# Patient Record
Sex: Female | Born: 1960 | Race: Black or African American | Hispanic: No | Smoking: Current every day smoker
Health system: Southern US, Community
[De-identification: ages and names within clinical notes are randomized; demographics above are authoritative.]

## PROBLEM LIST (undated history)

## (undated) DIAGNOSIS — I10 Essential (primary) hypertension: Secondary | ICD-10-CM

## (undated) DIAGNOSIS — E119 Type 2 diabetes mellitus without complications: Secondary | ICD-10-CM

## (undated) DIAGNOSIS — E7800 Pure hypercholesterolemia, unspecified: Secondary | ICD-10-CM

## (undated) DIAGNOSIS — E785 Hyperlipidemia, unspecified: Secondary | ICD-10-CM

## (undated) DIAGNOSIS — T7840XA Allergy, unspecified, initial encounter: Secondary | ICD-10-CM

## (undated) HISTORY — DX: Type 2 diabetes mellitus without complications: E11.9

## (undated) HISTORY — DX: Hyperlipidemia, unspecified: E78.5

## (undated) HISTORY — PX: ABDOMINAL HYSTERECTOMY: SHX81

## (undated) HISTORY — DX: Pure hypercholesterolemia, unspecified: E78.00

## (undated) HISTORY — PX: APPENDECTOMY: SHX54

## (undated) HISTORY — DX: Allergy, unspecified, initial encounter: T78.40XA

---

## 1998-06-29 ENCOUNTER — Other Ambulatory Visit: Admission: RE | Admit: 1998-06-29 | Discharge: 1998-06-29 | Payer: Self-pay | Admitting: Obstetrics and Gynecology

## 1998-09-04 ENCOUNTER — Encounter: Payer: Self-pay | Admitting: Obstetrics and Gynecology

## 1998-09-07 ENCOUNTER — Encounter (INDEPENDENT_AMBULATORY_CARE_PROVIDER_SITE_OTHER): Payer: Self-pay | Admitting: Specialist

## 1998-09-07 ENCOUNTER — Inpatient Hospital Stay (HOSPITAL_COMMUNITY): Admission: RE | Admit: 1998-09-07 | Discharge: 1998-09-09 | Payer: Self-pay | Admitting: Obstetrics and Gynecology

## 2003-10-06 ENCOUNTER — Emergency Department (HOSPITAL_COMMUNITY): Admission: EM | Admit: 2003-10-06 | Discharge: 2003-10-06 | Payer: Self-pay | Admitting: Family Medicine

## 2003-10-19 ENCOUNTER — Ambulatory Visit (HOSPITAL_COMMUNITY): Admission: RE | Admit: 2003-10-19 | Discharge: 2003-10-19 | Payer: Self-pay | Admitting: Obstetrics & Gynecology

## 2007-11-10 ENCOUNTER — Ambulatory Visit: Payer: Self-pay | Admitting: Family Medicine

## 2007-11-10 DIAGNOSIS — R519 Headache, unspecified: Secondary | ICD-10-CM

## 2007-11-10 DIAGNOSIS — Z8742 Personal history of other diseases of the female genital tract: Secondary | ICD-10-CM | POA: Insufficient documentation

## 2007-11-10 DIAGNOSIS — R51 Headache: Secondary | ICD-10-CM

## 2007-11-10 DIAGNOSIS — E1159 Type 2 diabetes mellitus with other circulatory complications: Secondary | ICD-10-CM

## 2007-11-10 DIAGNOSIS — I10 Essential (primary) hypertension: Secondary | ICD-10-CM

## 2007-11-10 HISTORY — DX: Headache, unspecified: R51.9

## 2007-11-12 LAB — CONVERTED CEMR LAB
ALT: 13 units/L (ref 0–35)
AST: 14 units/L (ref 0–37)
Albumin: 3.7 g/dL (ref 3.5–5.2)
Alkaline Phosphatase: 63 units/L (ref 39–117)
BUN: 11 mg/dL (ref 6–23)
Basophils Absolute: 0.1 10*3/uL (ref 0.0–0.1)
Basophils Relative: 0.8 % (ref 0.0–3.0)
Bilirubin, Direct: 0.1 mg/dL (ref 0.0–0.3)
CO2: 30 meq/L (ref 19–32)
Calcium: 8.9 mg/dL (ref 8.4–10.5)
Chloride: 110 meq/L (ref 96–112)
Creatinine, Ser: 0.9 mg/dL (ref 0.4–1.2)
Eosinophils Absolute: 0.2 10*3/uL (ref 0.0–0.7)
Eosinophils Relative: 2.6 % (ref 0.0–5.0)
GFR calc Af Amer: 87 mL/min
GFR calc non Af Amer: 72 mL/min
Glucose, Bld: 77 mg/dL (ref 70–99)
HCT: 39 % (ref 36.0–46.0)
Hemoglobin: 13.2 g/dL (ref 12.0–15.0)
Lymphocytes Relative: 33 % (ref 12.0–46.0)
MCHC: 34 g/dL (ref 30.0–36.0)
MCV: 87 fL (ref 78.0–100.0)
Monocytes Absolute: 0.3 10*3/uL (ref 0.1–1.0)
Monocytes Relative: 3.8 % (ref 3.0–12.0)
Neutro Abs: 5.1 10*3/uL (ref 1.4–7.7)
Neutrophils Relative %: 59.8 % (ref 43.0–77.0)
Platelets: 178 10*3/uL (ref 150–400)
Potassium: 4.3 meq/L (ref 3.5–5.1)
RBC: 4.48 M/uL (ref 3.87–5.11)
RDW: 14 % (ref 11.5–14.6)
Sodium: 142 meq/L (ref 135–145)
TSH: 1.5 microintl units/mL (ref 0.35–5.50)
Total Bilirubin: 0.5 mg/dL (ref 0.3–1.2)
Total Protein: 6.7 g/dL (ref 6.0–8.3)
WBC: 8.5 10*3/uL (ref 4.5–10.5)

## 2013-06-10 ENCOUNTER — Emergency Department (HOSPITAL_COMMUNITY): Payer: No Typology Code available for payment source

## 2013-06-10 ENCOUNTER — Emergency Department (HOSPITAL_COMMUNITY)
Admission: EM | Admit: 2013-06-10 | Discharge: 2013-06-10 | Disposition: A | Discharge status: Home / Self Care | Payer: No Typology Code available for payment source | Attending: Emergency Medicine | Admitting: Emergency Medicine

## 2013-06-10 DIAGNOSIS — I10 Essential (primary) hypertension: Secondary | ICD-10-CM | POA: Insufficient documentation

## 2013-06-10 DIAGNOSIS — Z880 Allergy status to penicillin: Secondary | ICD-10-CM | POA: Insufficient documentation

## 2013-06-10 DIAGNOSIS — R55 Syncope and collapse: Secondary | ICD-10-CM | POA: Insufficient documentation

## 2013-06-10 DIAGNOSIS — J40 Bronchitis, not specified as acute or chronic: Secondary | ICD-10-CM | POA: Insufficient documentation

## 2013-06-10 LAB — BASIC METABOLIC PANEL
BUN: 11 mg/dL (ref 6–23)
CO2: 26 mEq/L (ref 19–32)
Calcium: 9 mg/dL (ref 8.4–10.5)
Chloride: 100 mEq/L (ref 96–112)
Creatinine, Ser: 0.88 mg/dL (ref 0.50–1.10)
GFR calc Af Amer: 86 mL/min — ABNORMAL LOW (ref >90)
GFR calc non Af Amer: 74 mL/min — ABNORMAL LOW (ref >90)
Glucose, Bld: 122 mg/dL — ABNORMAL HIGH (ref 70–99)
Potassium: 3.4 mEq/L — ABNORMAL LOW (ref 3.7–5.3)
Sodium: 140 mEq/L (ref 137–147)

## 2013-06-10 LAB — URINALYSIS, ROUTINE W REFLEX MICROSCOPIC
Bilirubin Urine: NEGATIVE
Glucose, UA: 100 mg/dL — AB
Ketones, ur: NEGATIVE mg/dL
Leukocytes, UA: NEGATIVE
Nitrite: NEGATIVE
Protein, ur: 30 mg/dL — AB
Specific Gravity, Urine: 1.023 (ref 1.005–1.030)
Urobilinogen, UA: 1 mg/dL (ref 0.0–1.0)
pH: 7 (ref 5.0–8.0)

## 2013-06-10 LAB — URINE MICROSCOPIC-ADD ON

## 2013-06-10 LAB — CBC WITH DIFFERENTIAL/PLATELET
Basophils Absolute: 0 10*3/uL (ref 0.0–0.1)
Basophils Relative: 0 % (ref 0–1)
Eosinophils Absolute: 0.2 10*3/uL (ref 0.0–0.7)
Eosinophils Relative: 3 % (ref 0–5)
HCT: 40.7 % (ref 36.0–46.0)
Hemoglobin: 13.7 g/dL (ref 12.0–15.0)
Lymphocytes Relative: 30 % (ref 12–46)
Lymphs Abs: 1.7 10*3/uL (ref 0.7–4.0)
MCH: 28.4 pg (ref 26.0–34.0)
MCHC: 33.7 g/dL (ref 30.0–36.0)
MCV: 84.3 fL (ref 78.0–100.0)
Monocytes Absolute: 0.7 10*3/uL (ref 0.1–1.0)
Monocytes Relative: 13 % — ABNORMAL HIGH (ref 3–12)
Neutro Abs: 3.1 10*3/uL (ref 1.7–7.7)
Neutrophils Relative %: 54 % (ref 43–77)
Platelets: 150 10*3/uL (ref 150–400)
RBC: 4.83 MIL/uL (ref 3.87–5.11)
RDW: 14.9 % (ref 11.5–15.5)
WBC: 5.7 10*3/uL (ref 4.0–10.5)

## 2013-06-10 LAB — TROPONIN I: Troponin I: <0.3 ng/mL (ref <0.30)

## 2013-06-10 MED ORDER — LORAZEPAM 1 MG PO TABS
1.0000 mg | ORAL_TABLET | Freq: Once | ORAL | Status: AC
  Administered 2013-06-10: 1 mg via ORAL
  Filled 2013-06-10: qty 1

## 2013-06-10 MED ORDER — IBUPROFEN 200 MG PO TABS
600.0000 mg | ORAL_TABLET | Freq: Once | ORAL | Status: AC
  Administered 2013-06-10: 600 mg via ORAL
  Filled 2013-06-10: qty 3

## 2013-06-10 MED ORDER — ALBUTEROL SULFATE (2.5 MG/3ML) 0.083% IN NEBU
5.0000 mg | INHALATION_SOLUTION | Freq: Once | RESPIRATORY_TRACT | Status: AC
  Administered 2013-06-10: 5 mg via RESPIRATORY_TRACT
  Filled 2013-06-10: qty 6

## 2013-06-10 MED ORDER — IPRATROPIUM BROMIDE 0.02 % IN SOLN
0.5000 mg | Freq: Once | RESPIRATORY_TRACT | Status: AC
  Administered 2013-06-10: 0.5 mg via RESPIRATORY_TRACT
  Filled 2013-06-10: qty 2.5

## 2013-06-10 MED ORDER — PREDNISONE 20 MG PO TABS
60.0000 mg | ORAL_TABLET | Freq: Once | ORAL | Status: AC
  Administered 2013-06-10: 60 mg via ORAL
  Filled 2013-06-10: qty 3

## 2013-06-10 MED ORDER — ALBUTEROL SULFATE HFA 108 (90 BASE) MCG/ACT IN AERS
2.0000 | INHALATION_SPRAY | Freq: Once | RESPIRATORY_TRACT | Status: AC
  Administered 2013-06-10: 2 via RESPIRATORY_TRACT
  Filled 2013-06-10: qty 6.7

## 2013-06-10 MED ORDER — PREDNISONE 20 MG PO TABS: 40.0000 mg | ORAL_TABLET | Freq: Every day | ORAL | Status: DC

## 2013-06-10 NOTE — ED Provider Notes (Signed)
CSN: [id number]     Arrival date & time 06/10/13  1404 History   First MD Initiated Contact with Patient 06/10/13 1421     Chief Complaint  Patient presents with  . Loss of Consciousness     (Consider location/radiation/quality/duration/timing/severity/associated sxs/prior Treatment) HPI  52yF with syncope. Cough and congestion for past 3 days. Progressively worsening. Wheezing. Just before arrival she was sitting on edge of bed speaking with employer to tell them she wasn't feeling well when she began having increasing SOB and felt her throat closing. Thinks she then passed out. Not sure for how long. Woke up with people around her. Currently feeling better but still tightness in throat and chest. Fatigue. No fever. No unusual leg pain or swelling. No v/d. No urinary complaints. No diagnosed hx of lung disease but doesn't have routine medical care. Previous diagnosis of HTN, not on meds currently.   No past medical history on file. No past surgical history on file. No family history on file. History  Substance Use Topics  . Smoking status: Not on file  . Smokeless tobacco: Not on file  . Alcohol Use: Not on file   OB History   No data available     Review of Systems  All systems reviewed and negative, other than as noted in HPI.   Allergies  Penicillins  Home Medications  No current outpatient prescriptions on file. BP 168/113  Pulse 81  Resp 20  SpO2 100% Physical Exam  Nursing note and vitals reviewed. Constitutional: She appears well-developed and well-nourished. No distress.  HENT:  Head: Normocephalic and atraumatic.  Eyes: Conjunctivae are normal. Right eye exhibits no discharge. Left eye exhibits no discharge.  Neck: Neck supple.  Cardiovascular: Normal rate, regular rhythm and normal heart sounds.  Exam reveals no gallop and no friction rub.   No murmur heard. Pulmonary/Chest: Effort normal. No respiratory distress. She has wheezes.  B/l wheezing, L>R. mildy  prolonged expiratory phase. Can speak in complete sentences.   Abdominal: Soft. She exhibits no distension. There is no tenderness.  Musculoskeletal: She exhibits no edema and no tenderness.  Lower extremities symmetric as compared to each other. No calf tenderness. Negative Homan's. No palpable cords.   Neurological: She is alert.  Skin: Skin is warm and dry.  Psychiatric: She has a normal mood and affect. Her behavior is normal. Thought content normal.    ED Course  Procedures (including critical care time) Labs Review Labs Reviewed - No data to display Imaging Review No results found.   EKG Interpretation   Date/Time:  Thursday June 10 2013 14:18:55 EDT Ventricular Rate:  84 PR Interval:  154 QRS Duration: 85 QT Interval:  399 QTC Calculation: 472 R Axis:   70 Text Interpretation:  Sinus rhythm Borderline T wave abnormalities  Confirmed by Wilson Singer  MD, Shakerra Red ([id number]) on 06/10/2013 2:22:25 PM      MDM   Final diagnoses:  Bronchitis  Syncope    52yF with what is clinically bronchitis. Suspect viral illness. Improved with neb. Continued PRN albuterol and steroids. Unclear etiology of syncope. Would expect bronchospasm to point of syncope to have been in extremis. She is wheezing, but WOB not dramatically increased and o2 sats fine. Possibly hyperventilation? EKG with normal intervals. CXR clear. Labs fairly unrevealing. I feel safe for discharge. Return precautions discussed.    Virgel Manifold, MD 06/11/13 [id number]

## 2013-06-10 NOTE — ED Notes (Signed)
Walked pt to the bathroom without difficulty

## 2013-06-10 NOTE — ED Notes (Signed)
Prior to ambulating patient denies chest pain upon ambulating stated 6/10 pressure chest pain.  Pulse oximetry 98% room air. EDP notified and at bedside speaking with patient.  Patient stated "wants to go home"

## 2013-06-10 NOTE — ED Notes (Signed)
Patient in yard felt throat closing syncope unknown amount of time per EMS patient alert answering and following commands appropriate. EKG NSR CBG 120.  Albuterol 5mg  given wheezing left lung.

## 2013-06-10 NOTE — Discharge Instructions (Signed)
°Emergency Department Resource Guide °1) Find a Doctor and Pay Out of Pocket °Although you won't have to find out who is covered by your insurance plan, it is a good idea to ask around and get recommendations. You will then need to call the office and see if the doctor you have chosen will accept you as a new patient and what types of options they offer for patients who are self-pay. Some doctors offer discounts or will set up payment plans for their patients who do not have insurance, but you will need to ask so you aren't surprised when you get to your appointment. ° °2) Contact Your Local Health Department °Not all health departments have doctors that can see patients for sick visits, but many do, so it is worth a call to see if yours does. If you don't know where your local health department is, you can check in your phone book. The CDC also has a tool to help you locate your state's health department, and many state websites also have listings of all of their local health departments. ° °3) Find a Walk-in Clinic °If your illness is not likely to be very severe or complicated, you may want to try a walk in clinic. These are popping up all over the country in pharmacies, drugstores, and shopping centers. They're usually staffed by nurse practitioners or physician assistants that have been trained to treat common illnesses and complaints. They're usually fairly quick and inexpensive. However, if you have serious medical issues or chronic medical problems, these are probably not your best option. ° °No Primary Care Doctor: °- Call [facility] at  832-8000 - they can help you locate a primary care doctor that  accepts your insurance, provides certain services, etc. °- Physician Referral Service- 1-800-533-3463 ° °Chronic Pain Problems: °Organization         Address  Phone   Notes  °[facility]  (336) 297-2271 Patients need to be referred by their primary care doctor.  ° °Medication  Assistance: °Organization         Address  Phone   Notes  °[place] [facility] [address] °[place], [place] [address] (336) 641-8030 --Must be a resident of [place] °-- Must have NO insurance coverage whatsoever (no Medicaid/ Medicare, etc.) °-- The pt. MUST have a primary care doctor that directs their care regularly and follows them in the community °  °[facility]  (866) 331-1348   °[employer]  (888) 892-1162   ° °Agencies that provide inexpensive medical care: °Organization         Address  Phone   Notes  °[facility]  (336) 832-8035   °[facility]    (336) 832-7272   °[facility] [address] °[place], [place] [address] (336) 832-4777   °[facility] [address], °[place] (336) 271-4999   °[facility]    (336) 373-0678   °[facility]    (336) 272-1050   °[facility] ° [address], [place] Phone:  (336) 832-4444, Fax:  (336) 832-4440 Hours of Operation:  9 am - 6 pm, M-F.  Also accepts Medicaid/Medicare and self-pay.  °[facility] ° [address], [place] Phone: (336) 832-3150, Fax: (336) 832-3151. Hours of Operation:  8:30 am - 5:30 pm, M-F.  Also accepts Medicaid and self-pay.  °[facility] [place] [address], [place] Phone: (336) 878-6027   °[facility] [address], [place], [place] (336)723-1848, Ext. 123 Mondays & Thursdays: 7-9 AM.  First 15 patients are seen on a first come, first serve basis. °  ° °Medicaid-accepting [place] Providers: ° °Organization         Address  Phone   Notes  °[facility] [address], [place] (336) 641-2100 Also accepts self-pay patients.  °[facility] [address], [place] ° (336) 856-9996   °[facility] [address], [place]  (336) 288-8857   °[facility] [address] [place] [address], [place] (336) 299-7000   °Veita Bland [address], [place]  ° (336) 373-1557 Only accepts [place] Access Medicaid patients after they have their name applied to their card.  ° °Self-Pay (no insurance) in [place]: ° °Organization         Address  Phone   Notes  °Sickle Cell Patients, [facility] [address], [place] (336) 832-1970   °[facility] [address], [place] (336) 832-4400   °[facility] ° [address], [place] (336) 992-4800   °[facility]/Dr. Osei-Bonsu ° [address] [place] [address], [place] or [address], [place] (336) 841-8500 Phone number for both [place] and [place] locations is the same.  °[facility] [address], [place] (336) 299-0000   °[facility] [address] [place] [address], [place] or [address] (336) 852-7530 °(336) 878-2260   °[facility] [address], [place] (336) 350-1642, phone; (336) 294-5005, fax Sees patients 1st and 3rd Saturday of every month.  Must not qualify for public or private insurance (i.e. Medicaid, Medicare, [place] Health Choice, Veterans' Benefits) • Household income should be no more than 200% of the poverty level •The clinic cannot treat you if you are pregnant or think you are pregnant • Sexually transmitted diseases are not treated at the clinic.  ° ° °Dental Care: °Organization         Address  Phone  Notes  °[place] [facility] [address], [place] (336) 641-6152 Accepts children up to age 21 who are enrolled in Medicaid or [place] Health Choice; pregnant women with a Medicaid card; and children who have applied for Medicaid or [place] Health Choice, but were declined, whose parents can pay a reduced fee at time of service.  °[place]  [facility] [place]  [address], [place] (336) 641-7733 Accepts children up to age 21 who are enrolled in Medicaid or [place] Health Choice; pregnant women with a Medicaid card; and children who have applied for Medicaid or [place] Health Choice, but were declined, whose parents can pay a reduced fee at time of service.  °[facility] ° [address], [place] (336) 641-4533 Patients are seen by appointment only. Walk-ins are not accepted. [facility] will see patients 18 years of age and older. °Monday - Tuesday (8am-5pm) °Most Wednesdays (8:30-5pm) °$30 per visit, cash only  °[facility] ° [address], [place] (336) 641-4533 Patients are seen by appointment only. Walk-ins are not accepted. [facility] will see patients 18 years of age and older. °One   Wednesday Evening (Monthly: Volunteer Based).  $30 per visit, cash only  °[facility]  (919) 537-3737 for adults; Children under age 4, call Graduate Pediatric Dentistry at (919) [phone]. Children aged 4-14, please call (919) [phone] to request a pediatric application. ° Dental services are provided in all areas of dental care including fillings, crowns and bridges, complete and partial dentures, implants, gum treatment, root canals, and extractions. Preventive care is also provided. Treatment is provided to both adults and children. °Patients are selected via a lottery and there is often a waiting list. °  °[facility] [address], °[place] ° (336) 763-8833 [email or web address] °  °[facility] [address], [place], [place] (336)723-1848, Ext. 123 Second and Fourth Thursday of each month, opens at 6:30 AM; Clinic ends at 9 AM.  Patients are seen on a first-come first-served basis, and a limited number are seen during each clinic.  ° °[facility] ° [address], [place], [place] (336) 723-7904    Eligibility Requirements °You must have lived in [place], [place], or [place] counties for at least the last three months. °  You cannot be eligible for state or federal sponsored healthcare insurance, including Veterans Administration, Medicaid, or Medicare. °  You generally cannot be eligible for healthcare insurance through your employer.  °  How to apply: °Eligibility screenings are held every Tuesday and Wednesday afternoon from 1:00 pm until 4:00 pm. You do not need an appointment for the interview!  °[facility] [address], [place], [place] 336-631-2330   °[facility]  336-342-8273   °[facility]  336-703-3100   °[facility]  336-570-6415   ° °Behavioral Health Resources in the Community: °Intensive Outpatient Programs °Organization         Address  Phone  Notes  °[facility] [address], [place], [place] 336-878-6098   °[facility] [address], [place], [place] 336-832-9800   °[facility] [address], [place], [place] ° 336-882-2125   °[facility] [address],  °[place], [place] 1-800-853-5163 or 336-641-4981   °Substance Abuse Resources °Organization         Address  Phone  Notes  °[facility]  336-882-2125   °[facility]  336-784-9470   °[facility]  336-285-9073   °[facility]  336-845-3988   °Residential & Outpatient Substance Abuse Program  1-800-659-3381   °Psychological Services °Organization         Address  Phone  Notes  °[facility]  336- 832-9600   °[facility]  336- 378-7881   °[facility] [address], [place] 1-800-853-5163 or 336-641-4981   ° °Mobile Crisis Teams °Organization         Address  Phone  Notes  °[facility], Mobile Crisis Care Unit  1-877-626-1772   °Assertive °[facility] ° [address]  [place], [place] 336-834-9664   °Sharon DeEsch [address] °[place]  336-554-5454   ° °Self-Help/Support Groups °Organization         Address  Phone             Notes  °[facility] - variety of support groups  336- 373-1402 Call for more information  °Narcotics Anonymous (NA), [facility] [address], °[place]   2 meetings at this location  ° °  Residential Treatment Programs °Organization         Address  Phone  Notes  °[facility] [address],    °[place]  1-866-801-8205   °[facility] ° [address], [place], [place] 704-293-8524   °[facility] [address], [place] 336-845-3988 Admissions: 8am-3pm M-F  °[facility] [address]    °[place], [place] 336-841-1104   °[facility] [address], [place], [place] 336-379-7146   °[facility] [address]  °[place], [place] 336-285-9073   °[facility] - Intensive Outpatient [address], [place], [place] 336-852-3033   °[facility] [address]  °[place], [place] 1-877-615-2722 or 336-784-9470   °[facility] [address] [place], [place] 336-227-7417 Accepts Medicaid  °[facility] [address]  °[place] 1-800-659-3381 Substance Abuse/Addiction Treatment  ° °[place] Behavioral Health Resources °Organization         Address  Phone  Notes  °[facility]  (888) 581-9988   °Julie Brannon, PhD [address] [place], [place]   (336) 349-5553 or (336) 951-0000   °[facility]   [address] °[place], [place] (336) 349-4454   °[facility] [address], [place], [place] (336) 342-8316 Insurance/Medicaid/sponsorship through [facility]  °[facility] [address]                                    [place], [place] (336) 342-8316 Therapy/tele-psych/case    °[facility] [address]  ° [place], [place] (336) 349-2233    °Dr. Arfeen  (336) 349-4544   °[facility] [place]  [employer] [place] [facility] 1) [address], [place] °2) [address], [place] °3)  [address], [place] (336) 349-3220 °(336) 342-7768 ° °(336) 342-8140   °[place] [facility] (336) 342-1394 or (336) 342-3537 (After Hours)    ° ° °

## 2013-06-10 NOTE — ED Notes (Signed)
Another Nurse assisted patient from bathroom to room via wheelchair after patient ambulated to bathroom. States short of breath and general weakness. EDP notified. Ordered patient to ambulate with pulse oximetry.

## 2013-06-11 MED ORDER — PREDNISONE 20 MG PO TABS: 40.0000 mg | ORAL_TABLET | Freq: Every day | ORAL | Status: DC

## 2013-06-11 NOTE — Progress Notes (Signed)
ED CM received incoming call from patient and daughter regarding discharge medication. Pt states that she was discharged and forgot prescriptions. Pt was discharged on prednisone. Spoke with Dr. Karle Starch script printed for predniSONE (DELTASONE) 20 MG tablet Take 2 tablets (40 mg total) by mouth daily. Notified patient of prescription ready for pick up, also offered to fax to pharmacy patient's choice. Pt  States, she has located the prescription for prednisone.  Pt also states, she needs a letter for work. Pt instructed to pick up work letter at [employer]. Discussed the importance of following up with a PCP, information was given for the [facility].  Pt verbalized understanding and is agreeable to plan. No further ED CM needs identified.

## 2016-10-06 ENCOUNTER — Encounter (HOSPITAL_COMMUNITY): Payer: Self-pay | Admitting: Emergency Medicine

## 2016-10-06 ENCOUNTER — Other Ambulatory Visit: Payer: Self-pay

## 2016-10-06 ENCOUNTER — Emergency Department (HOSPITAL_COMMUNITY)
Admission: EM | Admit: 2016-10-06 | Discharge: 2016-10-07 | Disposition: A | Discharge status: Home / Self Care | Payer: No Typology Code available for payment source | Attending: Emergency Medicine | Admitting: Emergency Medicine

## 2016-10-06 ENCOUNTER — Emergency Department (HOSPITAL_COMMUNITY): Payer: No Typology Code available for payment source

## 2016-10-06 DIAGNOSIS — Z79899 Other long term (current) drug therapy: Secondary | ICD-10-CM | POA: Insufficient documentation

## 2016-10-06 DIAGNOSIS — J181 Lobar pneumonia, unspecified organism: Secondary | ICD-10-CM | POA: Insufficient documentation

## 2016-10-06 DIAGNOSIS — J189 Pneumonia, unspecified organism: Secondary | ICD-10-CM

## 2016-10-06 DIAGNOSIS — R0602 Shortness of breath: Secondary | ICD-10-CM | POA: Insufficient documentation

## 2016-10-06 DIAGNOSIS — R739 Hyperglycemia, unspecified: Secondary | ICD-10-CM | POA: Insufficient documentation

## 2016-10-06 DIAGNOSIS — F17210 Nicotine dependence, cigarettes, uncomplicated: Secondary | ICD-10-CM | POA: Insufficient documentation

## 2016-10-06 DIAGNOSIS — I10 Essential (primary) hypertension: Secondary | ICD-10-CM | POA: Insufficient documentation

## 2016-10-06 HISTORY — DX: Essential (primary) hypertension: I10

## 2016-10-06 LAB — COMPREHENSIVE METABOLIC PANEL
ALT: 13 U/L — AB (ref 14–54)
ANION GAP: 8 (ref 5–15)
AST: 14 U/L — ABNORMAL LOW (ref 15–41)
Albumin: 3.1 g/dL — ABNORMAL LOW (ref 3.5–5.0)
Alkaline Phosphatase: 77 U/L (ref 38–126)
BUN: 10 mg/dL (ref 6–20)
CALCIUM: 8.9 mg/dL (ref 8.9–10.3)
CHLORIDE: 99 mmol/L — AB (ref 101–111)
CO2: 23 mmol/L (ref 22–32)
Creatinine, Ser: 1.01 mg/dL — ABNORMAL HIGH (ref 0.44–1.00)
GFR calc non Af Amer: >60 mL/min (ref >60)
GLUCOSE: 346 mg/dL — AB (ref 65–99)
Potassium: 4.1 mmol/L (ref 3.5–5.1)
Sodium: 130 mmol/L — ABNORMAL LOW (ref 135–145)
Total Bilirubin: 0.4 mg/dL (ref 0.3–1.2)
Total Protein: 6.5 g/dL (ref 6.5–8.1)

## 2016-10-06 LAB — URINALYSIS, ROUTINE W REFLEX MICROSCOPIC
Bacteria, UA: NONE SEEN
Bilirubin Urine: NEGATIVE
Glucose, UA: >=500 mg/dL — AB
Ketones, ur: NEGATIVE mg/dL
Leukocytes, UA: NEGATIVE
Nitrite: NEGATIVE
PH: 6 (ref 5.0–8.0)
Protein, ur: 30 mg/dL — AB
Specific Gravity, Urine: 1.022 (ref 1.005–1.030)

## 2016-10-06 LAB — CBC WITH DIFFERENTIAL/PLATELET
Basophils Absolute: 0 10*3/uL (ref 0.0–0.1)
Basophils Relative: 0 %
EOS ABS: 0.2 10*3/uL (ref 0.0–0.7)
Eosinophils Relative: 2 %
HEMATOCRIT: 36.6 % (ref 36.0–46.0)
HEMOGLOBIN: 12.3 g/dL (ref 12.0–15.0)
Lymphocytes Relative: 20 %
Lymphs Abs: 2 10*3/uL (ref 0.7–4.0)
MCH: 27.1 pg (ref 26.0–34.0)
MCHC: 33.6 g/dL (ref 30.0–36.0)
MCV: 80.6 fL (ref 78.0–100.0)
MONOS PCT: 5 %
Monocytes Absolute: 0.5 10*3/uL (ref 0.1–1.0)
NEUTROS ABS: 7.3 10*3/uL (ref 1.7–7.7)
NEUTROS PCT: 73 %
Platelets: 181 10*3/uL (ref 150–400)
RBC: 4.54 MIL/uL (ref 3.87–5.11)
RDW: 14.9 % (ref 11.5–15.5)
WBC: 10 10*3/uL (ref 4.0–10.5)

## 2016-10-06 LAB — PROTIME-INR
INR: 1.04
PROTHROMBIN TIME: 13.6 s (ref 11.4–15.2)

## 2016-10-06 LAB — I-STAT CG4 LACTIC ACID, ED: Lactic Acid, Venous: 1.71 mmol/L (ref 0.5–1.9)

## 2016-10-06 IMAGING — DX DG CHEST 2V
2 series · 2 of 2 positions shown · non-contrast
Comparison: Radiographs [DATE].

CLINICAL DATA: Sepsis.

EXAM:
CHEST  2 VIEW

[chest lat]
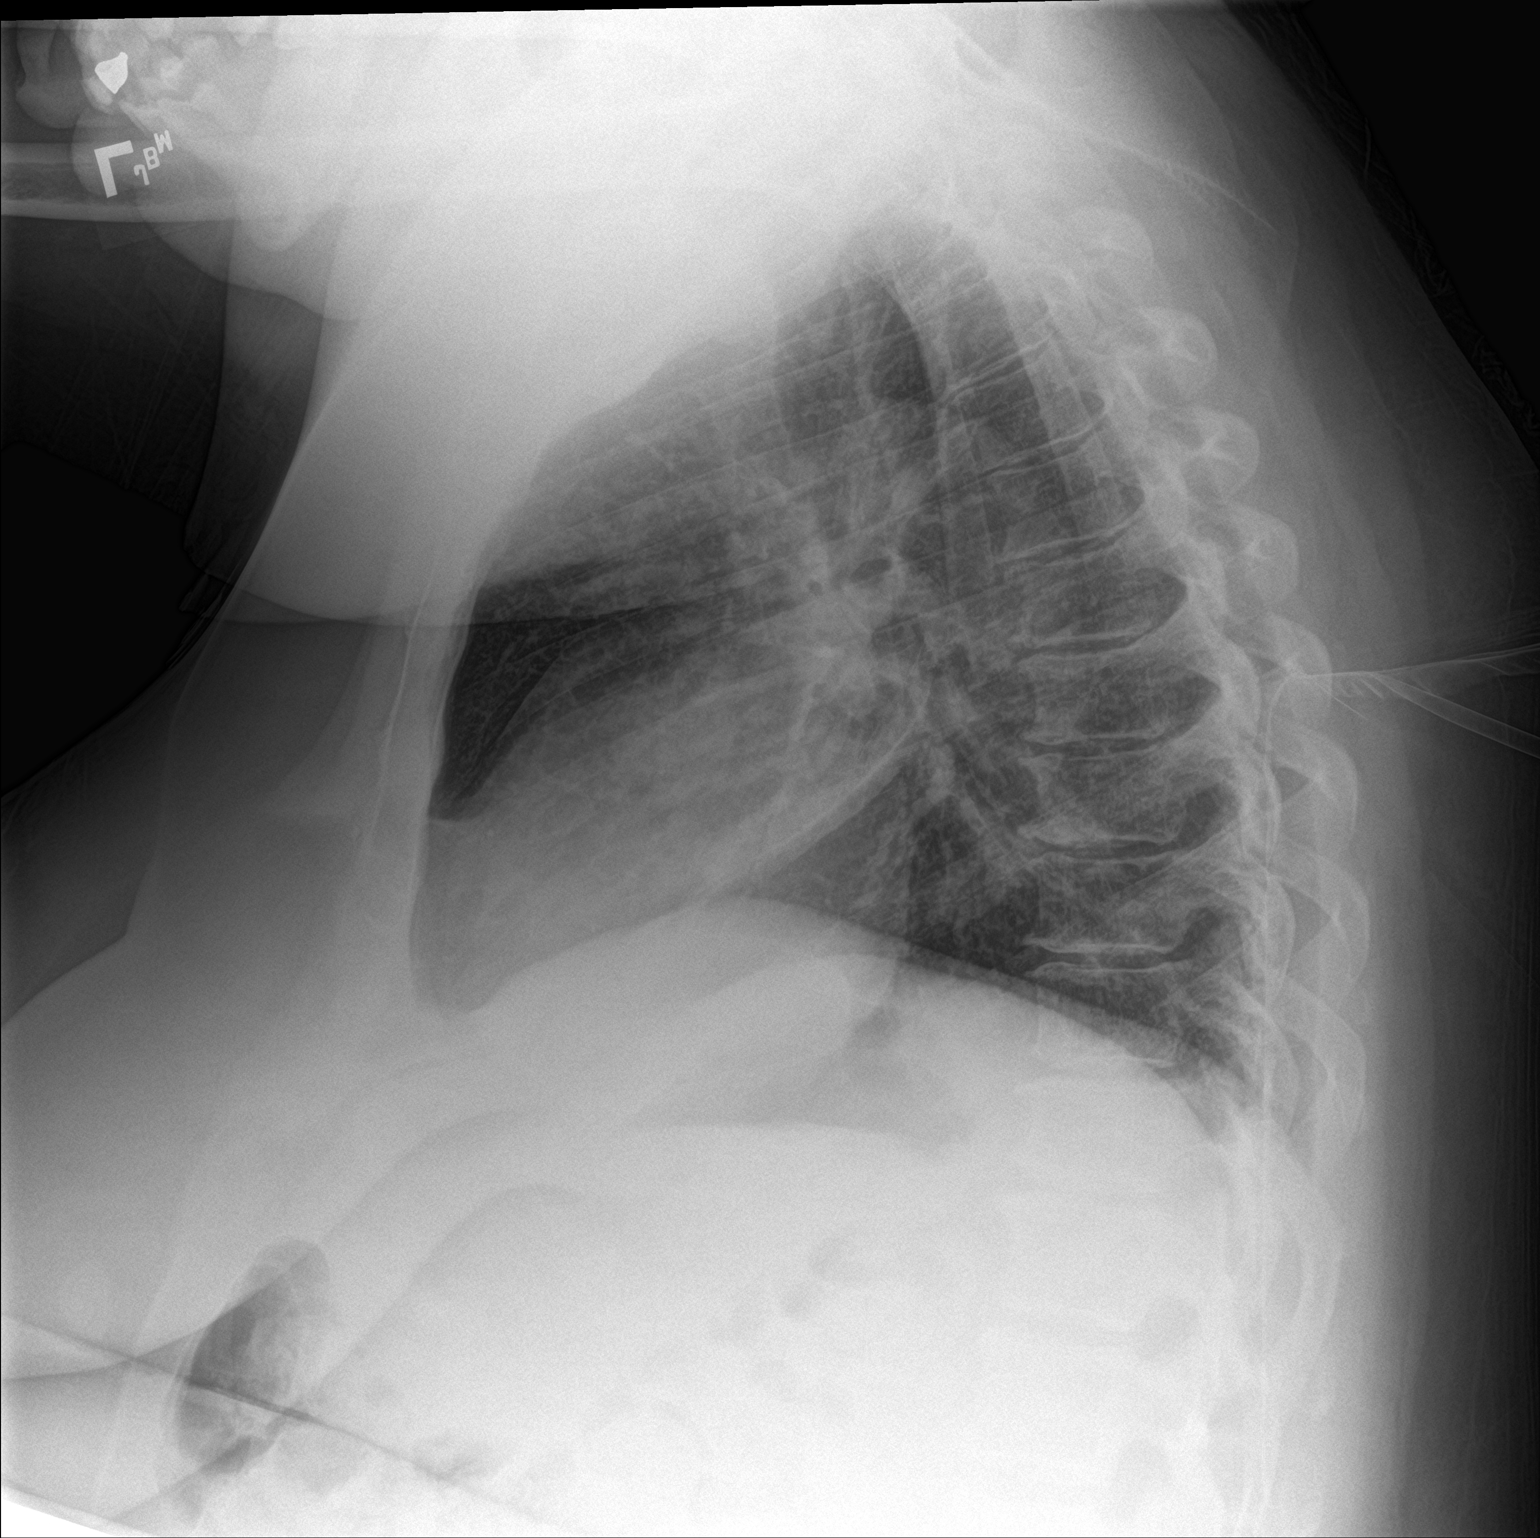

[chest ap]
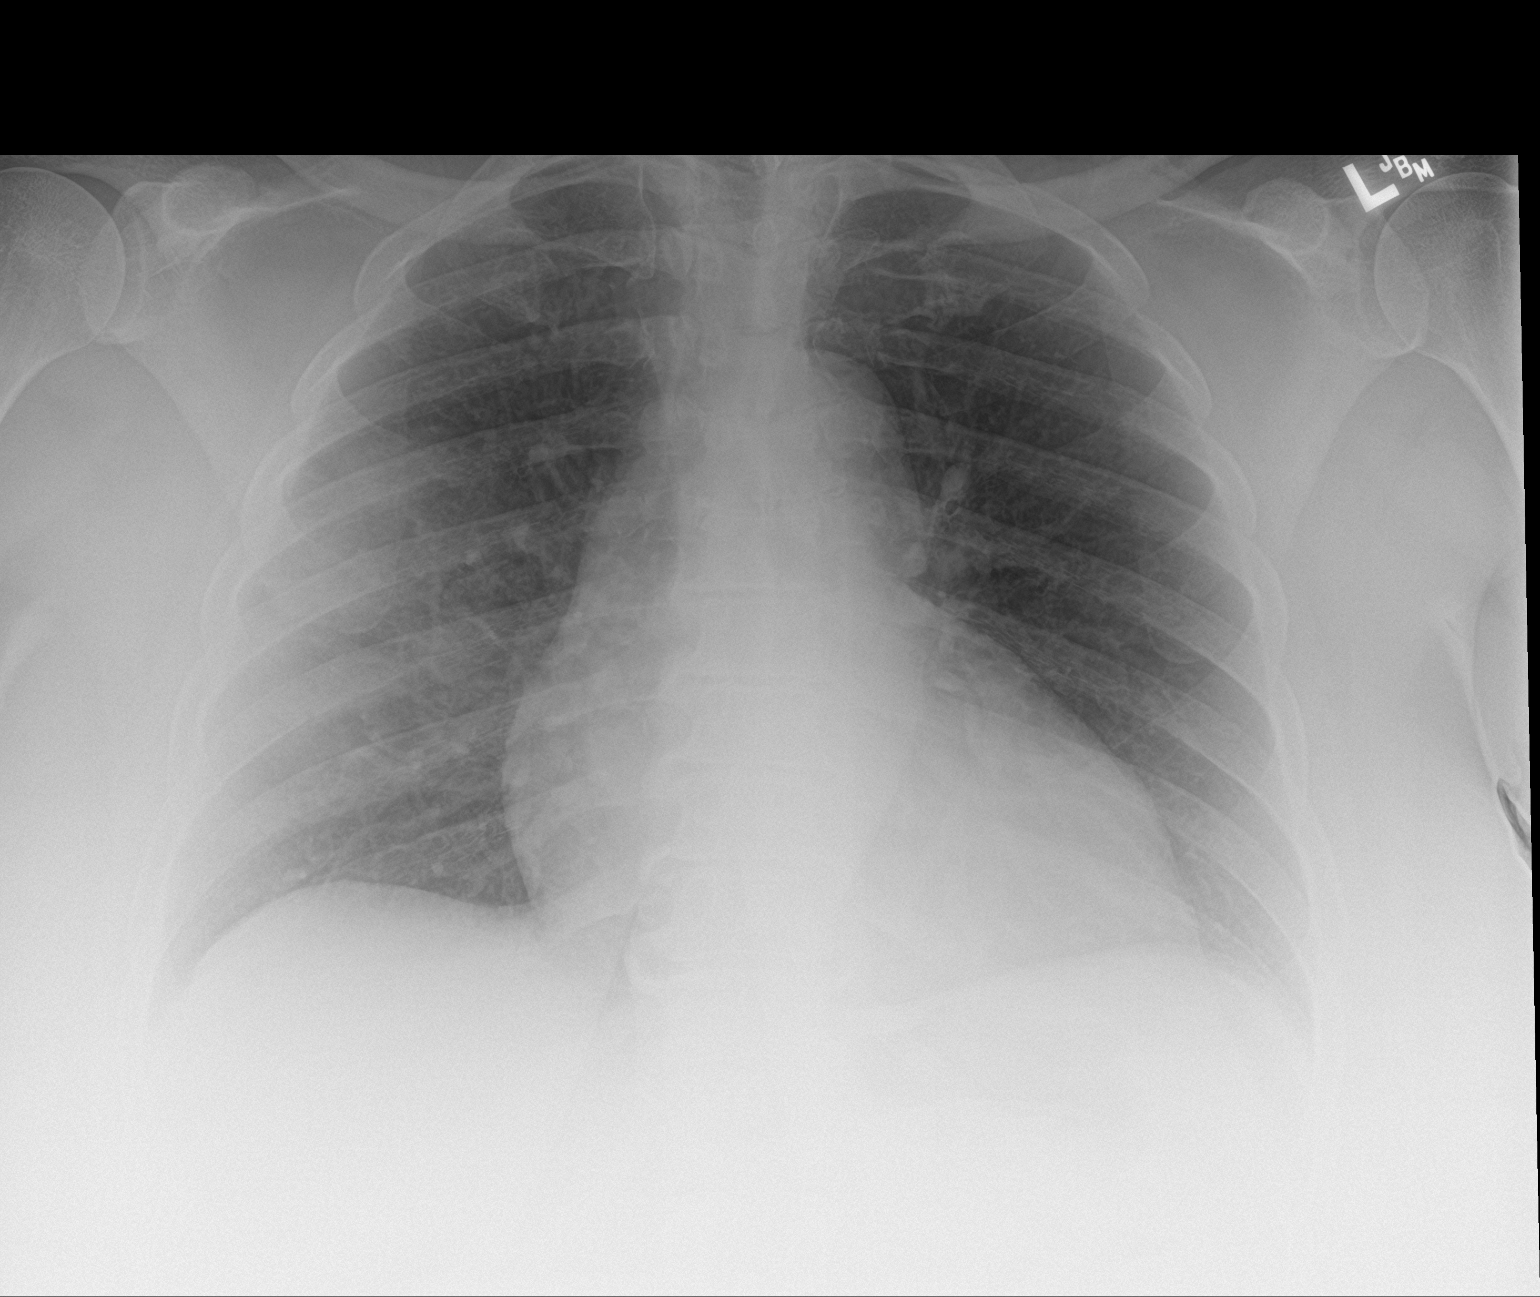

[2 of 2 positions shown; findings below may reference images not displayed]

FINDINGS: Stable cardiomediastinal silhouette. No pneumothorax or pleural
effusion is noted. Left lung is clear. Mild right middle lobe
opacity is noted on lateral radiograph concerning for focal
atelectasis or infiltrate. Bony thorax is unremarkable.
IMPRESSION: Mild right middle lobe opacity is seen on lateral projection
concerning for mild atelectasis or pneumonia. Followup PA and
lateral chest X-ray is recommended in 3-4 weeks following trial of
antibiotic therapy to ensure resolution and exclude underlying
malignancy.

## 2016-10-06 MED ORDER — METHYLPREDNISOLONE SODIUM SUCC 125 MG IJ SOLR
125.0000 mg | Freq: Once | INTRAMUSCULAR | Status: AC
  Administered 2016-10-06: 125 mg via INTRAVENOUS
  Filled 2016-10-06: qty 2

## 2016-10-06 MED ORDER — IPRATROPIUM BROMIDE 0.02 % IN SOLN
0.5000 mg | Freq: Once | RESPIRATORY_TRACT | Status: AC
  Administered 2016-10-06: 0.5 mg via RESPIRATORY_TRACT
  Filled 2016-10-06: qty 2.5

## 2016-10-06 MED ORDER — LEVOFLOXACIN IN D5W 750 MG/150ML IV SOLN: 750.0000 mg | INTRAVENOUS | Status: DC

## 2016-10-06 MED ORDER — SODIUM CHLORIDE 0.9 % IV BOLUS (SEPSIS)
1000.0000 mL | Freq: Once | INTRAVENOUS | Status: AC
  Administered 2016-10-06: 1000 mL via INTRAVENOUS

## 2016-10-06 MED ORDER — KETOROLAC TROMETHAMINE 30 MG/ML IJ SOLN
30.0000 mg | Freq: Once | INTRAMUSCULAR | Status: AC
  Administered 2016-10-06: 30 mg via INTRAVENOUS
  Filled 2016-10-06: qty 1

## 2016-10-06 MED ORDER — ACETAMINOPHEN 325 MG PO TABS
650.0000 mg | ORAL_TABLET | Freq: Once | ORAL | Status: AC | PRN
  Administered 2016-10-06: 650 mg via ORAL

## 2016-10-06 MED ORDER — CLONIDINE HCL 0.1 MG PO TABS
0.1000 mg | ORAL_TABLET | Freq: Once | ORAL | Status: AC
  Administered 2016-10-06: 0.1 mg via ORAL
  Filled 2016-10-06: qty 1

## 2016-10-06 MED ORDER — ALBUTEROL SULFATE (2.5 MG/3ML) 0.083% IN NEBU
5.0000 mg | INHALATION_SOLUTION | Freq: Once | RESPIRATORY_TRACT | Status: AC
  Administered 2016-10-06: 5 mg via RESPIRATORY_TRACT
  Filled 2016-10-06: qty 6

## 2016-10-06 MED ORDER — LEVOFLOXACIN IN D5W 750 MG/150ML IV SOLN
750.0000 mg | Freq: Once | INTRAVENOUS | Status: AC
  Administered 2016-10-06: 750 mg via INTRAVENOUS
  Filled 2016-10-06: qty 150

## 2016-10-06 MED ORDER — LISINOPRIL 10 MG PO TABS
10.0000 mg | ORAL_TABLET | Freq: Once | ORAL | Status: AC
  Administered 2016-10-06: 10 mg via ORAL
  Filled 2016-10-06: qty 1

## 2016-10-06 MED ORDER — ACETAMINOPHEN 325 MG PO TABS
ORAL_TABLET | ORAL | Status: DC
Start: 2016-10-06 — End: 2016-10-07
  Filled 2016-10-06: qty 2

## 2016-10-06 NOTE — ED Notes (Signed)
ED Provider at bedside. 

## 2016-10-06 NOTE — ED Notes (Signed)
Dr. Sherry Ruffing notified of possible sepsis.

## 2016-10-06 NOTE — ED Provider Notes (Signed)
[facility] DEPT Provider Note   CSN: [id number] Arrival date & time: 10/06/16  1856    History   Chief Complaint Chief Complaint  Patient presents with  . Fever    HPI Jane Lopez is a 56 y.o. female.  56 year old female with a history of hypertension presents to the emergency department for evaluation of fever. She reports a fever over the past 3-4 days which has been intermittent and temporarily responding to Tylenol. Tylenol last taken at 1600. She was also administered this medication in triage. She reports maximum temperature of 102F. She also has noted a pain to the right side of her mid back. This is worse with coughing and deep breathing. She has had a mild feeling of shortness of breath. No nausea, vomiting, dysuria, bowel changes. No reported sick contacts or recent admissions.      Past Medical History:  Diagnosis Date  . Hypertension     Patient Active Problem List   Diagnosis Date Noted  . HYPERTENSION 11/10/2007  . HEADACHE 11/10/2007  . DIABETES MELLITUS, GESTATIONAL, HX OF 11/10/2007    Past Surgical History:  Procedure Laterality Date  . ABDOMINAL HYSTERECTOMY    . APPENDECTOMY      OB History    No data available       Home Medications    Prior to Admission medications   Medication Sig Start Date End Date Taking? Authorizing Provider  acetaminophen (TYLENOL) 500 MG tablet Take 500 mg by mouth every 6 (six) hours as needed for fever.   Yes [provider]  LISINOPRIL PO Take 1 tablet by mouth daily.   Yes [provider]  benzonatate (TESSALON) 100 MG capsule Take 1 capsule (100 mg total) by mouth 3 (three) times daily as needed for cough. 10/07/16   Antonietta Breach, PA-C  HYDROcodone-acetaminophen (NORCO/VICODIN) 5-325 MG tablet Take 1-2 tablets by mouth every 6 (six) hours as needed for severe pain. 10/07/16   Antonietta Breach, PA-C  levofloxacin (LEVAQUIN) 750 MG tablet Take 1 tablet (750 mg total) by mouth daily. Start  on the evening of 10/07/16 10/07/16   Antonietta Breach, PA-C  predniSONE (DELTASONE) 20 MG tablet Take 2 tablets (40 mg total) by mouth daily. 10/07/16   Antonietta Breach, PA-C    Family History No family history on file.  Social History Social History  Substance Use Topics  . Smoking status: Current Every Day Smoker    Packs/day: 0.50    Types: Cigarettes  . Smokeless tobacco: Never Used  . Alcohol use No     Allergies   Penicillins   Review of Systems Review of Systems Ten systems reviewed and are negative for acute change, except as noted in the HPI.    Physical Exam Updated Vital Signs BP 122/66   Pulse 84   Temp 99.2 F (37.3 C) (Oral)   Resp 20   Ht 5' 8.5" (1.74 m)   Wt 131.5 kg (290 lb)   SpO2 97%   BMI 43.45 kg/m   Physical Exam  Constitutional: She is oriented to person, place, and time. She appears well-developed and well-nourished. No distress.  Nontoxic appearing and in no acute distress. Obese female.  HENT:  Head: Normocephalic and atraumatic.  Eyes: Conjunctivae and EOM are normal. No scleral icterus.  Neck: Normal range of motion.  Cardiovascular: Normal rate, regular rhythm and intact distal pulses.   Pulmonary/Chest: Effort normal. No respiratory distress. She has no wheezes.  Respirations even and unlabored. No wheezes or rhonchi.  Musculoskeletal: Normal range of motion.  Neurological: She is alert and oriented to person, place, and time. She exhibits normal muscle tone. Coordination normal.  GCS 15. Patient moving all extremities.  Skin: Skin is warm and dry. No rash noted. She is not diaphoretic. No erythema. No pallor.  Psychiatric: She has a normal mood and affect. Her behavior is normal.  Nursing note and vitals reviewed.    ED Treatments / Results  Labs (all labs ordered are listed, but only abnormal results are displayed) Labs Reviewed  COMPREHENSIVE METABOLIC PANEL - Abnormal; Notable for the following:       Result Value   Sodium 130  (*)    Chloride 99 (*)    Glucose, Bld 346 (*)    Creatinine, Ser 1.01 (*)    Albumin 3.1 (*)    AST 14 (*)    ALT 13 (*)    All other components within normal limits  URINALYSIS, ROUTINE W REFLEX MICROSCOPIC - Abnormal; Notable for the following:    Glucose, UA >=500 (*)    Hgb urine dipstick SMALL (*)    Protein, ur 30 (*)    Squamous Epithelial / LPF 0-5 (*)    All other components within normal limits  CULTURE, BLOOD (ROUTINE X 2)  CULTURE, BLOOD (ROUTINE X 2)  CBC WITH DIFFERENTIAL/PLATELET  PROTIME-INR  I-STAT CG4 LACTIC ACID, ED    EKG  EKG Interpretation None       Radiology Dg Chest 2 View  Result Date: 10/06/2016 CLINICAL DATA:  Sepsis. EXAM: CHEST  2 VIEW COMPARISON:  Radiographs of June 10, 2013. FINDINGS: Stable cardiomediastinal silhouette. No pneumothorax or pleural effusion is noted. Left lung is clear. Mild right middle lobe opacity is noted on lateral radiograph concerning for focal atelectasis or infiltrate. Bony thorax is unremarkable. IMPRESSION: Mild right middle lobe opacity is seen on lateral projection concerning for mild atelectasis or pneumonia. Followup PA and lateral chest X-ray is recommended in 3-4 weeks following trial of antibiotic therapy to ensure resolution and exclude underlying malignancy. Electronically Signed   By: Marijo Conception, M.D.   On: 10/06/2016 20:45    Procedures Procedures (including critical care time)  Medications Ordered in ED Medications  acetaminophen (TYLENOL) 325 MG tablet (not administered)  levofloxacin (LEVAQUIN) IVPB 750 mg (not administered)  albuterol (PROVENTIL HFA;VENTOLIN HFA) 108 (90 Base) MCG/ACT inhaler 2 puff (not administered)  acetaminophen (TYLENOL) tablet 650 mg (650 mg Oral Given 10/06/16 1924)  sodium chloride 0.9 % bolus 1,000 mL (0 mLs Intravenous Stopped 10/07/16 0002)  levofloxacin (LEVAQUIN) IVPB 750 mg (0 mg Intravenous Stopped 10/07/16 0039)  ketorolac (TORADOL) 30 MG/ML injection 30 mg (30  mg Intravenous Given 10/06/16 2209)  methylPREDNISolone sodium succinate (SOLU-MEDROL) 125 mg/2 mL injection 125 mg (125 mg Intravenous Given 10/06/16 2249)  albuterol (PROVENTIL) (2.5 MG/3ML) 0.083% nebulizer solution 5 mg (5 mg Nebulization Given 10/06/16 2247)  ipratropium (ATROVENT) nebulizer solution 0.5 mg (0.5 mg Nebulization Given 10/06/16 2246)  lisinopril (PRINIVIL,ZESTRIL) tablet 10 mg (10 mg Oral Given 10/06/16 2245)  cloNIDine (CATAPRES) tablet 0.1 mg (0.1 mg Oral Given 10/06/16 2245)  oxyCODONE-acetaminophen (PERCOCET/ROXICET) 5-325 MG per tablet 2 tablet (2 tablets Oral Given 10/07/16 0007)     Initial Impression / Assessment and Plan / ED Course  I have reviewed the triage vital signs and the nursing notes.  Pertinent labs & imaging results that were available during my care of the patient were reviewed by me and considered in my medical decision making (  see chart for details).     56 year old female presents to the emergency department for fever and fatigue x 4 days. She also notes pleuritic right sided chest pain and mild shortness of breath. Symptoms consistent with pneumonia visualized on chest x-ray. Patient has had a reassuring workup in the emergency department. She has no leukocytosis, tachycardia, or hypoxia. Patient feels mildly short of breath with ambulation, but does not experience saturations below 95% on room air. Symptoms have been managed in the emergency department with a DuoNeb, IV steroids, and IV Levaquin. Norco given for pleuritic chest pain.  Patient with a curb 65 score of 0. I believe she is stable for further outpatient management and follow-up with a primary care doctor. Supportive management discussed and prescription for Levaquin as well as incentive spirometer given. Return precautions provided at discharge. Patient agreeable to plan with no unaddressed concerns; discharged in satisfactory condition.  Vitals:   10/06/16 2300 10/06/16 2315 10/06/16 2330  10/07/16 0000  BP: 137/69 126/79 (!) 144/71 122/66  Pulse: 75 86 89 84  Resp:      Temp:      TempSrc:      SpO2: 100% 100% 99% 97%  Weight:      Height:        Final Clinical Impressions(s) / ED Diagnoses   Final diagnoses:  Community acquired pneumonia of right middle lobe of lung (HCC)  Hyperglycemia    New Prescriptions New Prescriptions   BENZONATATE (TESSALON) 100 MG CAPSULE    Take 1 capsule (100 mg total) by mouth 3 (three) times daily as needed for cough.   HYDROCODONE-ACETAMINOPHEN (NORCO/VICODIN) 5-325 MG TABLET    Take 1-2 tablets by mouth every 6 (six) hours as needed for severe pain.   LEVOFLOXACIN (LEVAQUIN) 750 MG TABLET    Take 1 tablet (750 mg total) by mouth daily. Start on the evening of 10/07/16   PREDNISONE (DELTASONE) 20 MG TABLET    Take 2 tablets (40 mg total) by mouth daily.     Antonietta Breach, PA-C 10/07/16 6283    Margette Fast, MD 10/09/16 [phone]

## 2016-10-06 NOTE — Progress Notes (Signed)
Pharmacy Antibiotic Note  NEVADA KIRCHNER is a 56 y.o. female admitted on 10/06/2016 with pneumonia.  Pharmacy has been consulted for levofloxacin dosing.  Plan: Levofloxacin 750mg  IV q24h Follow clinical progression, c/s, renal function, LOT  Height: 5' 8.5" (174 cm) Weight: 290 lb (131.5 kg) IBW/kg (Calculated) : 65.05  No data recorded.   Recent Labs Lab 10/06/16 1930 10/06/16 1952  WBC 10.0  --   CREATININE 1.01*  --   LATICACIDVEN  --  1.71    Estimated Creatinine Clearance: 91.1 mL/min (A) (by C-G formula based on SCr of 1.01 mg/dL (H)).    Allergies  Allergen Reactions  . Penicillins Rash    Antimicrobials this admission: Levofloxacin 7/29 >>   Dose adjustments this admission: n/a  Microbiology results: 7/29 BCx:   Thank you for allowing pharmacy to be a part of this patient's care.  Tatayana Beshears D. Aylene Acoff, PharmD, BCPS Clinical Pharmacist 10/06/2016 9:07 PM

## 2016-10-06 NOTE — ED Notes (Signed)
Pt ambulated in hallway. Sats remained 95-98% RA. Pt states she feels SOB. RR 22. EDP made aware.

## 2016-10-06 NOTE — ED Notes (Signed)
Pt wheezing on assessment. EDP made aware

## 2016-10-06 NOTE — ED Notes (Signed)
BP 191/92. EDP made aware.

## 2016-10-06 NOTE — ED Triage Notes (Addendum)
Reports fever for the last three days and just not feeling well.  Also reports urinating frequently.  Denies any dysuria.  Temp elevated in triage.  Last dose of medication was ibuprofen at 3pm.  Tylenol given in triage.  Also reporting right side chest pain.  Dyspneic at rest.

## 2016-10-07 MED ORDER — LEVOFLOXACIN 750 MG PO TABS: 750.0000 mg | ORAL_TABLET | Freq: Every day | ORAL | 0 refills | Status: DC

## 2016-10-07 MED ORDER — ONDANSETRON HCL 4 MG/2ML IJ SOLN
4.0000 mg | Freq: Once | INTRAMUSCULAR | Status: AC
  Administered 2016-10-07: 4 mg via INTRAVENOUS
  Filled 2016-10-07: qty 2

## 2016-10-07 MED ORDER — BENZONATATE 100 MG PO CAPS: 100.0000 mg | ORAL_CAPSULE | Freq: Three times a day (TID) | ORAL | 0 refills | Status: DC | PRN

## 2016-10-07 MED ORDER — PREDNISONE 20 MG PO TABS: 40.0000 mg | ORAL_TABLET | Freq: Every day | ORAL | 0 refills | Status: DC

## 2016-10-07 MED ORDER — ALBUTEROL SULFATE HFA 108 (90 BASE) MCG/ACT IN AERS
2.0000 | INHALATION_SPRAY | Freq: Once | RESPIRATORY_TRACT | Status: AC
  Administered 2016-10-07: 2 via RESPIRATORY_TRACT
  Filled 2016-10-07: qty 6.7

## 2016-10-07 MED ORDER — HYDROCODONE-ACETAMINOPHEN 5-325 MG PO TABS: 1.0000 | ORAL_TABLET | Freq: Four times a day (QID) | ORAL | 0 refills | Status: DC | PRN

## 2016-10-07 MED ORDER — OXYCODONE-ACETAMINOPHEN 5-325 MG PO TABS
2.0000 | ORAL_TABLET | Freq: Once | ORAL | Status: AC
  Administered 2016-10-07: 2 via ORAL
  Filled 2016-10-07: qty 2

## 2016-10-07 NOTE — Discharge Instructions (Signed)
Take Levaquin and prednisone as prescribed until finished. You may use Tessalon for cough and 2 puffs of an albuterol inhaler every 6 hours as needed for shortness of breath or wheezing. Take Norco as needed for pain, otherwise you may take 600mg  ibuprofen for pain management. Use an incentive spirometer at least once per hour while awake to ensure that you're taking deep breaths. Follow-up with a primary care doctor to ensure resolution of symptoms and for a recheck of your sugar levels. You may return for new or concerning symptoms.

## 2016-10-07 NOTE — ED Notes (Signed)
Pt states she feels nauseous. EDP made aware. Verbal order for zofran.

## 2016-10-11 LAB — CULTURE, BLOOD (ROUTINE X 2)
CULTURE: NO GROWTH
Culture: NO GROWTH
Special Requests: ADEQUATE
Special Requests: ADEQUATE

## 2018-05-07 ENCOUNTER — Ambulatory Visit (INDEPENDENT_AMBULATORY_CARE_PROVIDER_SITE_OTHER): Payer: BLUE CROSS/BLUE SHIELD | Admitting: Family Medicine

## 2018-05-07 ENCOUNTER — Other Ambulatory Visit: Payer: Self-pay | Admitting: *Deleted

## 2018-05-07 ENCOUNTER — Ambulatory Visit: Payer: Self-pay | Admitting: Family Medicine

## 2018-05-07 ENCOUNTER — Encounter: Payer: Self-pay | Admitting: Family Medicine

## 2018-05-07 VITALS — BP 220/110 | HR 61 | Ht 68.0 in | Wt 289.8 lb

## 2018-05-07 DIAGNOSIS — E1165 Type 2 diabetes mellitus with hyperglycemia: Secondary | ICD-10-CM | POA: Insufficient documentation

## 2018-05-07 DIAGNOSIS — I10 Essential (primary) hypertension: Secondary | ICD-10-CM | POA: Diagnosis not present

## 2018-05-07 DIAGNOSIS — E1159 Type 2 diabetes mellitus with other circulatory complications: Secondary | ICD-10-CM

## 2018-05-07 DIAGNOSIS — F17200 Nicotine dependence, unspecified, uncomplicated: Secondary | ICD-10-CM | POA: Diagnosis not present

## 2018-05-07 DIAGNOSIS — R9431 Abnormal electrocardiogram [ECG] [EKG]: Secondary | ICD-10-CM

## 2018-05-07 DIAGNOSIS — R002 Palpitations: Secondary | ICD-10-CM | POA: Diagnosis not present

## 2018-05-07 DIAGNOSIS — R631 Polydipsia: Secondary | ICD-10-CM

## 2018-05-07 DIAGNOSIS — R358 Other polyuria: Secondary | ICD-10-CM

## 2018-05-07 DIAGNOSIS — E118 Type 2 diabetes mellitus with unspecified complications: Secondary | ICD-10-CM | POA: Insufficient documentation

## 2018-05-07 DIAGNOSIS — I152 Hypertension secondary to endocrine disorders: Secondary | ICD-10-CM

## 2018-05-07 DIAGNOSIS — R3589 Other polyuria: Secondary | ICD-10-CM

## 2018-05-07 HISTORY — DX: Palpitations: R00.2

## 2018-05-07 LAB — POCT URINALYSIS DIP (PROADVANTAGE DEVICE)
BILIRUBIN UA: NEGATIVE
Blood, UA: NEGATIVE
Glucose, UA: 250 mg/dL — AB
LEUKOCYTES UA: NEGATIVE
Nitrite, UA: NEGATIVE
PH UA: 6 (ref 5.0–8.0)
SPECIFIC GRAVITY, URINE: 1.025
Urobilinogen, Ur: NEGATIVE

## 2018-05-07 LAB — POCT GLYCOSYLATED HEMOGLOBIN (HGB A1C): HEMOGLOBIN A1C: 11.8 % — AB (ref 4.0–5.6)

## 2018-05-07 LAB — GLUCOSE, POCT (MANUAL RESULT ENTRY): POC GLUCOSE: 305 mg/dL — AB (ref 70–99)

## 2018-05-07 MED ORDER — FLUCONAZOLE 150 MG PO TABS: 150.0000 mg | ORAL_TABLET | Freq: Once | ORAL | 0 refills | Status: AC

## 2018-05-07 MED ORDER — INSULIN GLARGINE (1 UNIT DIAL) 300 UNIT/ML ~~LOC~~ SOPN: 10.0000 [IU] | PEN_INJECTOR | Freq: Every day | SUBCUTANEOUS | 0 refills | Status: DC

## 2018-05-07 MED ORDER — METFORMIN HCL 500 MG PO TABS: 500.0000 mg | ORAL_TABLET | Freq: Two times a day (BID) | ORAL | 1 refills | Status: DC

## 2018-05-07 MED ORDER — LOSARTAN POTASSIUM-HCTZ 100-25 MG PO TABS: 1.0000 | ORAL_TABLET | Freq: Every day | ORAL | 1 refills | Status: DC

## 2018-05-07 NOTE — Patient Instructions (Addendum)
Your hemoglobin A1c is 11.8% today.  This means that your blood sugars are close to 300 daily.  Cut back on sweets, carbohydrates (potatoes, rice, bread, pasta).  Stop drinking any drinks with sugar.  Take the metformin twice daily with meals. Take the Toujeo every day around the same time, 10 units.  Continue checking your blood sugars daily.  You can take these in the morning before you eat or 2 hours after a meal. Please keep a record of your readings and bring these into your next appointment.  Your blood pressure is very high.  Start on the medication, Hyzaar. I recommend that you get a blood pressure cuff and start checking your blood pressures. Keep a record of these as well and bring them to your next appointment.  Make sure you are eating a low-salt diet.  I recommend that you stop smoking.  You will receive a call from the medical nutritionist to schedule an appointment.  They will help you with diet.  Please get a diabetic eye exam in the next couple of months.  Return to see me in 1 to 2 weeks and please bring in all of your readings   DASH Eating Plan DASH stands for "Dietary Approaches to Stop Hypertension." The DASH eating plan is a healthy eating plan that has been shown to reduce high blood pressure (hypertension). It may also reduce your risk for type 2 diabetes, heart disease, and stroke. The DASH eating plan may also help with weight loss. What are tips for following this plan?  General guidelines  Avoid eating more than 2,300 mg (milligrams) of salt (sodium) a day. If you have hypertension, you may need to reduce your sodium intake to 1,500 mg a day.  Limit alcohol intake to no more than 1 drink a day for nonpregnant women and 2 drinks a day for men. One drink equals 12 oz of beer, 5 oz of wine, or 1 oz of hard liquor.  Work with your health care provider to maintain a healthy body weight or to lose weight. Ask what an ideal weight is for you.  Get at least  30 minutes of exercise that causes your heart to beat faster (aerobic exercise) most days of the week. Activities may include walking, swimming, or biking.  Work with your health care provider or diet and nutrition specialist (dietitian) to adjust your eating plan to your individual calorie needs. Reading food labels   Check food labels for the amount of sodium per serving. Choose foods with less than 5 percent of the Daily Value of sodium. Generally, foods with less than 300 mg of sodium per serving fit into this eating plan.  To find whole grains, look for the word "whole" as the first word in the ingredient list. Shopping  Buy products labeled as "low-sodium" or "no salt added."  Buy fresh foods. Avoid canned foods and premade or frozen meals. Cooking  Avoid adding salt when cooking. Use salt-free seasonings or herbs instead of table salt or sea salt. Check with your health care provider or pharmacist before using salt substitutes.  Do not fry foods. Cook foods using healthy methods such as baking, boiling, grilling, and broiling instead.  Cook with heart-healthy oils, such as olive, canola, soybean, or sunflower oil. Meal planning  Eat a balanced diet that includes: ? 5 or more servings of fruits and vegetables each day. At each meal, try to fill half of your plate with fruits and vegetables. ? Up to 6-8  servings of whole grains each day. ? Less than 6 oz of lean meat, poultry, or fish each day. A 3-oz serving of meat is about the same size as a deck of cards. One egg equals 1 oz. ? 2 servings of low-fat dairy each day. ? A serving of nuts, seeds, or beans 5 times each week. ? Heart-healthy fats. Healthy fats called Omega-3 fatty acids are found in foods such as flaxseeds and coldwater fish, like sardines, salmon, and mackerel.  Limit how much you eat of the following: ? Canned or prepackaged foods. ? Food that is high in trans fat, such as fried foods. ? Food that is high in  saturated fat, such as fatty meat. ? Sweets, desserts, sugary drinks, and other foods with added sugar. ? Full-fat dairy products.  Do not salt foods before eating.  Try to eat at least 2 vegetarian meals each week.  Eat more home-cooked food and less restaurant, buffet, and fast food.  When eating at a restaurant, ask that your food be prepared with less salt or no salt, if possible. What foods are recommended? The items listed may not be a complete list. Talk with your dietitian about what dietary choices are best for you. Grains Whole-grain or whole-wheat bread. Whole-grain or whole-wheat pasta. Brown rice. [place]. Bulgur. Whole-grain and low-sodium cereals. Pita bread. Low-fat, low-sodium crackers. Whole-wheat flour tortillas. Vegetables Fresh or frozen vegetables (raw, steamed, roasted, or grilled). Low-sodium or reduced-sodium tomato and vegetable juice. Low-sodium or reduced-sodium tomato sauce and tomato paste. Low-sodium or reduced-sodium canned vegetables. Fruits All fresh, dried, or frozen fruit. Canned fruit in natural juice (without added sugar). Meat and other protein foods Skinless chicken or [place]. Ground chicken or [place]. Pork with fat trimmed off. Fish and seafood. Egg whites. Dried beans, peas, or lentils. Unsalted nuts, nut butters, and seeds. Unsalted canned beans. Lean cuts of beef with fat trimmed off. Low-sodium, lean deli meat. Dairy Low-fat (1%) or fat-free (skim) milk. Fat-free, low-fat, or reduced-fat cheeses. Nonfat, low-sodium ricotta or cottage cheese. Low-fat or nonfat yogurt. Low-fat, low-sodium cheese. Fats and oils Soft margarine without trans fats. Vegetable oil. Low-fat, reduced-fat, or light mayonnaise and salad dressings (reduced-sodium). Canola, safflower, olive, soybean, and sunflower oils. Avocado. Seasoning and other foods Herbs. Spices. Seasoning mixes without salt. Unsalted popcorn and pretzels. Fat-free sweets. What foods are not  recommended? The items listed may not be a complete list. Talk with your dietitian about what dietary choices are best for you. Grains Baked goods made with fat, such as croissants, muffins, or some breads. Dry pasta or rice meal packs. Vegetables Creamed or fried vegetables. Vegetables in a cheese sauce. Regular canned vegetables (not low-sodium or reduced-sodium). Regular canned tomato sauce and paste (not low-sodium or reduced-sodium). Regular tomato and vegetable juice (not low-sodium or reduced-sodium). Angie Fava. Olives. Fruits Canned fruit in a light or heavy syrup. Fried fruit. Fruit in cream or butter sauce. Meat and other protein foods Fatty cuts of meat. Ribs. Fried meat. Berniece Salines. Sausage. Bologna and other processed lunch meats. Salami. Fatback. Hotdogs. Bratwurst. Salted nuts and seeds. Canned beans with added salt. Canned or smoked fish. Whole eggs or egg yolks. Chicken or [place] with skin. Dairy Whole or 2% milk, cream, and half-and-half. Whole or full-fat cream cheese. Whole-fat or sweetened yogurt. Full-fat cheese. Nondairy creamers. Whipped toppings. Processed cheese and cheese spreads. Fats and oils Butter. Stick margarine. Lard. Shortening. Ghee. Bacon fat. Tropical oils, such as coconut, palm kernel, or palm oil. Seasoning and other  foods Salted popcorn and pretzels. Onion salt, garlic salt, seasoned salt, table salt, and sea salt. Worcestershire sauce. Tartar sauce. Barbecue sauce. Teriyaki sauce. Soy sauce, including reduced-sodium. Steak sauce. Canned and packaged gravies. Fish sauce. Oyster sauce. Cocktail sauce. Horseradish that you find on the shelf. Ketchup. Mustard. Meat flavorings and tenderizers. Bouillon cubes. Hot sauce and Tabasco sauce. Premade or packaged marinades. Premade or packaged taco seasonings. Relishes. Regular salad dressings. Where to find more information:  National Heart, Lung, and [place]: https://wilson-eaton.com/  American Heart Association:  www.heart.org Summary  The DASH eating plan is a healthy eating plan that has been shown to reduce high blood pressure (hypertension). It may also reduce your risk for type 2 diabetes, heart disease, and stroke.  With the DASH eating plan, you should limit salt (sodium) intake to 2,300 mg a day. If you have hypertension, you may need to reduce your sodium intake to 1,500 mg a day.  When on the DASH eating plan, aim to eat more fresh fruits and vegetables, whole grains, lean proteins, low-fat dairy, and heart-healthy fats.  Work with your health care provider or diet and nutrition specialist (dietitian) to adjust your eating plan to your individual calorie needs. This information is not intended to replace advice given to you by your health care provider. Make sure you discuss any questions you have with your health care provider. Document Released: 02/14/2011 Document Revised: 02/19/2016 Document Reviewed: 02/19/2016 Elsevier Interactive Patient Education  2019 [employer].

## 2018-05-07 NOTE — Progress Notes (Signed)
Subjective:    Patient ID: Jane Lopez, female    DOB: [birth year], 58 y.o.   MRN: [id number]  HPI Chief Complaint  Patient presents with  . new pt    new pt, history of DM and BP issues. hasn't seen a doctor in 2 years due to insurance. yeast infection   She is new to the practice and here to establish care.  No medical care in 2 years per patient. No record available from previous care years ago.   Diabetes -diagnosed 3 years ago.  States she recalls Metformin caused her to have stomach issues. She cannot remember which medications she was taking for diabetes but stopped it 2 years ago. States she did not have health insurance.   States she has been checking her blood sugars everyday. Reports they are usually in the 300 range and have been for months.  States she determines how she eats by how high her readings are.   Complains of polydipsia and polyuria.  Reports having intermittent left sided facial numbness and left fingertips are numb occasionally. This has been ongoing for the past 5 months. Lasts for a few minutes and resolves spontaneously.  States she is having a lot of vaginal itching. Treating it over the counter.  Unexplained weight loss of 10-12 lbs.   HTN- diagnosed in her late 53s with her 2nd pregnancy. She has an expired prescription bottle with her from 2017, Hyzaar 100-25 mg. Has not been on medication in 2 years or longer. Does not check her BP at home.   She also reports having intermittent night sweats and hot flashes that are associated with menopause.  Hysterectomy 20 years ago. Palpitations- states she has felt like her pulse was racing on 2 occasions for 10 minutes. No chest pain or shortness of breath.   Divorced. 3 kids, 7 grandchildren. She is working Engineer, building services. Travels a lot. Typically she is out of town Monday-Thursday  Alcohol occasionally.   She is a smoker and not ready to stop.   Denies fever, chills, dizziness, chest pain, shortness of  breath, abdominal pain, N/V/D, LE edema.    Reviewed allergies, medications, past medical, surgical, family, and social history.   Review of Systems Pertinent positives and negatives in the history of present illness.     Objective:   Physical Exam Constitutional:      General: She is not in acute distress.    Appearance: She is obese.  HENT:     Mouth/Throat:     Lips: Pink.     Mouth: Mucous membranes are moist.     Tongue: Tongue does not protrude in midline.     Pharynx: Oropharynx is clear.  Eyes:     General: Lids are normal. Vision grossly intact.     Extraocular Movements: Extraocular movements intact.     Conjunctiva/sclera: Conjunctivae normal.  Neck:     Musculoskeletal: Normal range of motion and neck supple.     Thyroid: No thyromegaly.  Cardiovascular:     Rate and Rhythm: Normal rate and regular rhythm.     Pulses: Normal pulses.  Pulmonary:     Effort: Pulmonary effort is normal.     Breath sounds: Normal breath sounds.  Musculoskeletal: Normal range of motion.     Right lower leg: No edema.     Left lower leg: No edema.  Lymphadenopathy:     Cervical: No cervical adenopathy.     Upper Body:     Right upper body:  No supraclavicular adenopathy.     Left upper body: No supraclavicular adenopathy.  Skin:    General: Skin is warm and dry.     Capillary Refill: Capillary refill takes less than 2 seconds.     Coloration: Skin is not pale.     Findings: No rash.  Neurological:     General: No focal deficit present.     Mental Status: She is alert and oriented to person, place, and time.     Cranial Nerves: Cranial nerves are intact.     Sensory: Sensation is intact.     Motor: Motor function is intact.     Coordination: Coordination is intact.     Gait: Gait is intact.     Deep Tendon Reflexes: Reflexes are normal and symmetric.  Psychiatric:        Attention and Perception: Attention normal.        Mood and Affect: Mood normal.        Speech:  Speech normal.        Behavior: Behavior normal.    BP (!) 220/110 (BP Location: Left Arm, Cuff Size: Large)   Pulse 61   Ht 5\' 8"  (1.727 m)   Wt 289 lb 12.8 oz (131.5 kg)   BMI 44.06 kg/m       Assessment & Plan:  Uncontrolled type 2 diabetes mellitus with hyperglycemia (HCC) - Plan: CBC with Differential/Platelet, Comprehensive metabolic panel, Insulin and C-Peptide, POCT Urinalysis DIP (Proadvantage Device), TSH, T4, free, T3, Lipid panel, EKG 12-Lead, Amb ref to Medical Nutrition Therapy-MNT, metFORMIN (GLUCOPHAGE) 500 MG tablet  Hypertension associated with diabetes ([place]) - Plan: CBC with Differential/Platelet, Comprehensive metabolic panel, POCT Urinalysis DIP (Proadvantage Device), Amb ref to Medical Nutrition Therapy-MNT, losartan-hydrochlorothiazide (HYZAAR) 100-25 MG tablet  Morbid obesity (HCC) - Plan: Comprehensive metabolic panel, TSH, T4, free, T3, Lipid panel, Amb ref to Medical Nutrition Therapy-MNT  Smoker  Polydipsia  Polyuria - Plan: POCT Urinalysis DIP (Proadvantage Device)  Severe uncontrolled hypertension - Plan: CBC with Differential/Platelet, Comprehensive metabolic panel, losartan-hydrochlorothiazide (HYZAAR) 100-25 MG tablet  Intermittent palpitations - Plan: EKG 12-Lead  Abnormal EKG  Urinalysis dipstick shows 3+ glucose, trace ketones and trace of protein.  Otherwise negative Hemoglobin A1c is 11.8%. PCO T fingerstick glucose 305.  She ate 2 hours prior. She is symptomatic but sounds as though she has been this way for over a year.  In depth counseling on the diabetes spectrum and health consequences of uncontrolled HTN including heart disease, stroke, renal disease, blindness, etc.  Will start her back on Metformin bid and basal insulin 10 units daily. She was hesitant to start on insulin but she agreed and was able to do a return demonstration with the [facility]. Continue checking BS 1-2 times daily. Keep a log.  Counseling on healthy diet by reducing  carbohydrates.  Referral to MNT.  Discussed standards of care for diabetes including that she will need a diabetic eye exam and will at some point need to start on a statin. She declines today.   Discussed that her BP is severely elevated. No sign of target end organ damage.  Will check labs.  She has an expired prescription bottle with Hyzaar 100/25.  I will send in this medication for her today to lower her blood pressure. Counseling on low sodium diet. Encouraged her to start checking her BP at home.  ECG abnormal. Nonspecific ST changes. NSR. Normal rate.  No chest pain or shortness of breath.  Smoking cessation done. She  is not ready to stop.  Advised that she has several CV risk factors including uncontrolled DM, uncontrolled HTN, morbid obesity, smoking.  Follow up in 1-2 weeks and bring in all readings. Start on statin at that time if she is willing.

## 2018-05-08 ENCOUNTER — Telehealth: Payer: Self-pay | Admitting: Family Medicine

## 2018-05-08 ENCOUNTER — Other Ambulatory Visit: Payer: Self-pay

## 2018-05-08 DIAGNOSIS — E1165 Type 2 diabetes mellitus with hyperglycemia: Secondary | ICD-10-CM

## 2018-05-08 DIAGNOSIS — E1159 Type 2 diabetes mellitus with other circulatory complications: Secondary | ICD-10-CM

## 2018-05-08 DIAGNOSIS — I152 Hypertension secondary to endocrine disorders: Secondary | ICD-10-CM

## 2018-05-08 DIAGNOSIS — I10 Essential (primary) hypertension: Secondary | ICD-10-CM

## 2018-05-08 LAB — CBC WITH DIFFERENTIAL/PLATELET
Basophils Absolute: 0 10*3/uL (ref 0.0–0.2)
Basos: 1 %
EOS (ABSOLUTE): 0.2 10*3/uL (ref 0.0–0.4)
EOS: 3 %
HEMATOCRIT: 39 % (ref 34.0–46.6)
HEMOGLOBIN: 13 g/dL (ref 11.1–15.9)
IMMATURE GRANS (ABS): 0 10*3/uL (ref 0.0–0.1)
IMMATURE GRANULOCYTES: 0 %
Lymphocytes Absolute: 2.8 10*3/uL (ref 0.7–3.1)
Lymphs: 40 %
MCH: 27.3 pg (ref 26.6–33.0)
MCHC: 33.3 g/dL (ref 31.5–35.7)
MCV: 82 fL (ref 79–97)
MONOCYTES: 8 %
MONOS ABS: 0.5 10*3/uL (ref 0.1–0.9)
NEUTROS PCT: 48 %
Neutrophils Absolute: 3.4 10*3/uL (ref 1.4–7.0)
Platelets: 207 10*3/uL (ref 150–450)
RBC: 4.76 x10E6/uL (ref 3.77–5.28)
RDW: 13.9 % (ref 11.7–15.4)
WBC: 7 10*3/uL (ref 3.4–10.8)

## 2018-05-08 LAB — COMPREHENSIVE METABOLIC PANEL
A/G RATIO: 1.7 (ref 1.2–2.2)
ALBUMIN: 4 g/dL (ref 3.8–4.9)
ALT: 12 IU/L (ref 0–32)
AST: 6 IU/L (ref 0–40)
Alkaline Phosphatase: 93 IU/L (ref 39–117)
BUN/Creatinine Ratio: 14 (ref 9–23)
BUN: 11 mg/dL (ref 6–24)
Bilirubin Total: 0.2 mg/dL (ref 0.0–1.2)
CALCIUM: 9.5 mg/dL (ref 8.7–10.2)
CO2: 25 mmol/L (ref 20–29)
CREATININE: 0.81 mg/dL (ref 0.57–1.00)
Chloride: 99 mmol/L (ref 96–106)
GFR, EST AFRICAN AMERICAN: 93 mL/min/{1.73_m2} (ref >59)
GFR, EST NON AFRICAN AMERICAN: 81 mL/min/{1.73_m2} (ref >59)
GLOBULIN, TOTAL: 2.3 g/dL (ref 1.5–4.5)
Glucose: 261 mg/dL — ABNORMAL HIGH (ref 65–99)
POTASSIUM: 4.2 mmol/L (ref 3.5–5.2)
SODIUM: 138 mmol/L (ref 134–144)
TOTAL PROTEIN: 6.3 g/dL (ref 6.0–8.5)

## 2018-05-08 LAB — LIPID PANEL
CHOLESTEROL TOTAL: 179 mg/dL (ref 100–199)
Chol/HDL Ratio: 4.4 ratio (ref 0.0–4.4)
HDL: 41 mg/dL (ref >39)
LDL Calculated: 115 mg/dL — ABNORMAL HIGH (ref 0–99)
Triglycerides: 113 mg/dL (ref 0–149)
VLDL CHOLESTEROL CAL: 23 mg/dL (ref 5–40)

## 2018-05-08 LAB — TSH: TSH: 0.577 u[IU]/mL (ref 0.450–4.500)

## 2018-05-08 LAB — T4, FREE: Free T4: 1.29 ng/dL (ref 0.82–1.77)

## 2018-05-08 LAB — T3: T3, Total: 108 ng/dL (ref 71–180)

## 2018-05-08 LAB — INSULIN AND C-PEPTIDE, SERUM
C PEPTIDE: 4.2 ng/mL (ref 1.1–4.4)
INSULIN: 16.5 u[IU]/mL (ref 2.6–24.9)

## 2018-05-08 MED ORDER — INSULIN GLARGINE (1 UNIT DIAL) 300 UNIT/ML ~~LOC~~ SOPN: 10.0000 [IU] | PEN_INJECTOR | Freq: Every day | SUBCUTANEOUS | 0 refills | Status: DC

## 2018-05-08 MED ORDER — METFORMIN HCL 500 MG PO TABS: 500.0000 mg | ORAL_TABLET | Freq: Two times a day (BID) | ORAL | 1 refills | Status: DC

## 2018-05-08 MED ORDER — LOSARTAN POTASSIUM-HCTZ 100-25 MG PO TABS: 1.0000 | ORAL_TABLET | Freq: Every day | ORAL | 1 refills | Status: DC

## 2018-05-08 NOTE — Telephone Encounter (Signed)
Sent in rx to [facility].

## 2018-05-08 NOTE — Telephone Encounter (Signed)
Pt called and asked to get her prescriptions transferred to the [facility] on [address] because the [facility] it was sent to will not accept her insurance

## 2018-05-08 NOTE — Telephone Encounter (Signed)
Please take care of this. Thanks  

## 2018-05-22 ENCOUNTER — Ambulatory Visit (INDEPENDENT_AMBULATORY_CARE_PROVIDER_SITE_OTHER): Payer: BC Managed Care – PPO | Admitting: Family Medicine

## 2018-05-22 ENCOUNTER — Other Ambulatory Visit: Payer: Self-pay

## 2018-05-22 ENCOUNTER — Encounter: Payer: Self-pay | Admitting: Family Medicine

## 2018-05-22 ENCOUNTER — Telehealth: Payer: Self-pay | Admitting: Family Medicine

## 2018-05-22 VITALS — BP 140/90 | HR 55 | Wt 289.6 lb

## 2018-05-22 DIAGNOSIS — E1165 Type 2 diabetes mellitus with hyperglycemia: Secondary | ICD-10-CM

## 2018-05-22 DIAGNOSIS — E1159 Type 2 diabetes mellitus with other circulatory complications: Secondary | ICD-10-CM

## 2018-05-22 DIAGNOSIS — I10 Essential (primary) hypertension: Secondary | ICD-10-CM

## 2018-05-22 DIAGNOSIS — E119 Type 2 diabetes mellitus without complications: Secondary | ICD-10-CM | POA: Diagnosis not present

## 2018-05-22 DIAGNOSIS — I152 Hypertension secondary to endocrine disorders: Secondary | ICD-10-CM

## 2018-05-22 DIAGNOSIS — E7800 Pure hypercholesterolemia, unspecified: Secondary | ICD-10-CM

## 2018-05-22 LAB — HM DIABETES EYE EXAM

## 2018-05-22 MED ORDER — INSULIN GLARGINE (1 UNIT DIAL) 300 UNIT/ML ~~LOC~~ SOPN: 10.0000 [IU] | PEN_INJECTOR | Freq: Every day | SUBCUTANEOUS | 0 refills | Status: DC

## 2018-05-22 MED ORDER — LOSARTAN POTASSIUM-HCTZ 100-25 MG PO TABS: 1.0000 | ORAL_TABLET | Freq: Every day | ORAL | 0 refills | Status: DC

## 2018-05-22 MED ORDER — METFORMIN HCL 500 MG PO TABS: 500.0000 mg | ORAL_TABLET | Freq: Two times a day (BID) | ORAL | 0 refills | Status: DC

## 2018-05-22 MED ORDER — ATORVASTATIN CALCIUM 10 MG PO TABS: 10.0000 mg | ORAL_TABLET | Freq: Every day | ORAL | 0 refills | Status: DC

## 2018-05-22 NOTE — Patient Instructions (Signed)
Your blood pressure today is 140/90 and is improving.  Continue on your medication.  Continue eating a low-sodium diet.  Your blood sugars are improving but not yet to goal.  Continue on your current medications.   I am starting you on a medication for your cholesterol called atorvastatin.  Take this once daily.  Return in 6 weeks for fasting labs.  This is a nurse visit.  I will see you back for a diabetes and hypertension visit in 3 months.

## 2018-05-22 NOTE — Telephone Encounter (Signed)
Pt called back and stated that she was not given any refills on new medication Toujeo and the pen is only a 30 day supply. She wants to know what does she do after thiry days because she is not due back for three months. Pt can be reached at [phone].

## 2018-05-22 NOTE — Addendum Note (Signed)
Addended by: Minette Headland A on: 05/22/2018 10:31 AM   Modules accepted: Orders

## 2018-05-22 NOTE — Progress Notes (Signed)
   Subjective:    Patient ID: Jane Lopez, female    DOB: 1961-02-14, 58 y.o.   MRN: [id number]  HPI Chief Complaint  Patient presents with  . diabetes    diabetes, htn, lump under right arm. sugars been running 150- 200's   She is here for 2-week follow-up on diabetes and hypertension.  This is her second visit.  Prior to 2 weeks ago she has been off of her diabetes and HTN medications for at least 2 years.   States she is taking medication daily and no side effects.  Diet improved.   Reports no longer having polyuria. Feeling better overall.  No new concerns.  Denies fever, chills, dizziness, chest pain, palpitations, shortness of breath, abdominal pain, N/V/D, urinary symptoms.   Has an eye appointment later today at Dr. Irven Shelling office.    Review of Systems Pertinent positives and negatives in the history of present illness.     Objective:   Physical Exam BP 140/90   Pulse (!) 55   Wt 289 lb 9.6 oz (131.4 kg)   BMI 44.03 kg/m    Alert and oriented and in no acute distress. Not otherwise examined.      Assessment & Plan:  Uncontrolled type 2 diabetes mellitus with hyperglycemia ([place]) - Plan: metFORMIN (GLUCOPHAGE) 500 MG tablet, DISCONTINUED: Insulin Glargine, 1 Unit Dial, (TOUJEO SOLOSTAR) 300 UNIT/ML SOPN  Hypertension associated with diabetes ([place]) - Plan: losartan-hydrochlorothiazide (HYZAAR) 100-25 MG tablet  Morbid obesity (HCC)  Elevated LDL cholesterol level - Plan: atorvastatin (LIPITOR) 10 MG tablet  Severe uncontrolled hypertension - Plan: losartan-hydrochlorothiazide (HYZAAR) 100-25 MG tablet  Doing well on medications. No side effects. Checking blood sugars and they are improving. No longer symptomatic.  She is agreeable to start on a statin.  Continue eating a healthy diet.  Follow up in 6 weeks for fasting labs and in 3 months for follow up on diabetes, HTN.

## 2018-05-22 NOTE — Telephone Encounter (Signed)
Sent 90 days to [facility]

## 2018-05-29 ENCOUNTER — Encounter: Payer: Self-pay | Admitting: Internal Medicine

## 2018-06-05 ENCOUNTER — Ambulatory Visit: Payer: Self-pay | Admitting: *Deleted

## 2018-06-10 ENCOUNTER — Telehealth: Payer: Self-pay | Admitting: Family Medicine

## 2018-06-10 DIAGNOSIS — E1159 Type 2 diabetes mellitus with other circulatory complications: Secondary | ICD-10-CM

## 2018-06-10 DIAGNOSIS — I10 Essential (primary) hypertension: Secondary | ICD-10-CM

## 2018-06-10 MED ORDER — LOSARTAN POTASSIUM-HCTZ 100-25 MG PO TABS
1.0000 | ORAL_TABLET | Freq: Every day | ORAL | 0 refills | Status: DC
Start: 2018-06-10 — End: 2018-07-27

## 2018-06-10 NOTE — Telephone Encounter (Signed)
Ok to send in a refill. She is on losartan-HCTZ I believe.

## 2018-06-10 NOTE — Telephone Encounter (Signed)
Pt called and stated that she is out of town and wanted to know if she can get a refill of her Losartan sent to the [facility] in [place]. [place] Their number is [phone]

## 2018-06-10 NOTE — Telephone Encounter (Signed)
Sent med to pharmacy  

## 2018-06-18 ENCOUNTER — Telehealth: Payer: Self-pay | Admitting: Family Medicine

## 2018-06-18 NOTE — Telephone Encounter (Signed)
Pt called and states that she needs a RX for needles to go with her insulin pen pt did not get it with her medicine that was sent in, pt uses [place], [place] - [address]

## 2018-06-22 MED ORDER — INSULIN PEN NEEDLE 32G X 4 MM MISC: 1 refills | Status: AC

## 2018-06-22 NOTE — Telephone Encounter (Signed)
done

## 2018-07-03 ENCOUNTER — Other Ambulatory Visit: Payer: BLUE CROSS/BLUE SHIELD

## 2018-07-17 ENCOUNTER — Other Ambulatory Visit: Payer: BLUE CROSS/BLUE SHIELD

## 2018-07-25 ENCOUNTER — Other Ambulatory Visit: Payer: Self-pay | Admitting: Family Medicine

## 2018-07-25 DIAGNOSIS — E1159 Type 2 diabetes mellitus with other circulatory complications: Secondary | ICD-10-CM

## 2018-07-25 DIAGNOSIS — I10 Essential (primary) hypertension: Secondary | ICD-10-CM

## 2018-10-01 ENCOUNTER — Other Ambulatory Visit: Payer: Self-pay | Admitting: Internal Medicine

## 2018-10-01 DIAGNOSIS — E1165 Type 2 diabetes mellitus with hyperglycemia: Secondary | ICD-10-CM

## 2018-10-01 DIAGNOSIS — E1159 Type 2 diabetes mellitus with other circulatory complications: Secondary | ICD-10-CM

## 2018-10-01 DIAGNOSIS — E7800 Pure hypercholesterolemia, unspecified: Secondary | ICD-10-CM

## 2018-10-01 DIAGNOSIS — I152 Hypertension secondary to endocrine disorders: Secondary | ICD-10-CM

## 2018-10-01 DIAGNOSIS — I10 Essential (primary) hypertension: Secondary | ICD-10-CM

## 2018-10-01 MED ORDER — LOSARTAN POTASSIUM-HCTZ 100-25 MG PO TABS: 1.0000 | ORAL_TABLET | Freq: Every day | ORAL | 0 refills | Status: DC

## 2018-10-01 MED ORDER — METFORMIN HCL 500 MG PO TABS: 500.0000 mg | ORAL_TABLET | Freq: Two times a day (BID) | ORAL | 0 refills | Status: DC

## 2018-10-01 MED ORDER — TOUJEO SOLOSTAR 300 UNIT/ML ~~LOC~~ SOPN: 10.0000 [IU] | PEN_INJECTOR | Freq: Every day | SUBCUTANEOUS | 0 refills | Status: DC

## 2018-10-01 MED ORDER — ATORVASTATIN CALCIUM 10 MG PO TABS: 10.0000 mg | ORAL_TABLET | Freq: Every day | ORAL | 0 refills | Status: DC

## 2018-11-06 ENCOUNTER — Ambulatory Visit: Payer: BLUE CROSS/BLUE SHIELD | Admitting: Family Medicine

## 2018-11-13 ENCOUNTER — Ambulatory Visit: Payer: BC Managed Care – PPO | Admitting: Family Medicine

## 2019-05-19 ENCOUNTER — Other Ambulatory Visit: Payer: Self-pay | Admitting: Family Medicine

## 2019-05-19 DIAGNOSIS — E7800 Pure hypercholesterolemia, unspecified: Secondary | ICD-10-CM

## 2019-05-19 NOTE — Telephone Encounter (Signed)
Left message for pt to call back  to schedule as she is over due

## 2019-07-16 ENCOUNTER — Encounter: Payer: BC Managed Care – PPO | Admitting: Family Medicine

## 2019-07-16 ENCOUNTER — Telehealth: Payer: Self-pay | Admitting: Internal Medicine

## 2019-07-16 DIAGNOSIS — E7800 Pure hypercholesterolemia, unspecified: Secondary | ICD-10-CM

## 2019-07-16 MED ORDER — ATORVASTATIN CALCIUM 10 MG PO TABS: 10.0000 mg | ORAL_TABLET | Freq: Every day | ORAL | 0 refills | Status: DC

## 2019-07-16 NOTE — Telephone Encounter (Signed)
Last refill on atorvastatin was 10/01/18 #90. Pt states she is has only been out for a month. I will refill for 30 days

## 2019-08-13 ENCOUNTER — Ambulatory Visit (INDEPENDENT_AMBULATORY_CARE_PROVIDER_SITE_OTHER): Payer: BC Managed Care – PPO | Admitting: Family Medicine

## 2019-08-13 ENCOUNTER — Encounter: Payer: Self-pay | Admitting: Family Medicine

## 2019-08-13 ENCOUNTER — Encounter: Payer: Self-pay | Admitting: Gastroenterology

## 2019-08-13 ENCOUNTER — Other Ambulatory Visit: Payer: Self-pay

## 2019-08-13 VITALS — BP 194/92 | HR 55 | Ht 68.0 in | Wt 269.2 lb

## 2019-08-13 DIAGNOSIS — E1165 Type 2 diabetes mellitus with hyperglycemia: Secondary | ICD-10-CM | POA: Diagnosis not present

## 2019-08-13 DIAGNOSIS — N951 Menopausal and female climacteric states: Secondary | ICD-10-CM | POA: Diagnosis not present

## 2019-08-13 DIAGNOSIS — E1159 Type 2 diabetes mellitus with other circulatory complications: Secondary | ICD-10-CM

## 2019-08-13 DIAGNOSIS — Z0000 Encounter for general adult medical examination without abnormal findings: Secondary | ICD-10-CM

## 2019-08-13 DIAGNOSIS — I10 Essential (primary) hypertension: Secondary | ICD-10-CM | POA: Diagnosis not present

## 2019-08-13 DIAGNOSIS — E2839 Other primary ovarian failure: Secondary | ICD-10-CM | POA: Insufficient documentation

## 2019-08-13 DIAGNOSIS — Z136 Encounter for screening for cardiovascular disorders: Secondary | ICD-10-CM | POA: Diagnosis not present

## 2019-08-13 DIAGNOSIS — Z1239 Encounter for other screening for malignant neoplasm of breast: Secondary | ICD-10-CM

## 2019-08-13 DIAGNOSIS — Z1211 Encounter for screening for malignant neoplasm of colon: Secondary | ICD-10-CM | POA: Diagnosis not present

## 2019-08-13 DIAGNOSIS — E7800 Pure hypercholesterolemia, unspecified: Secondary | ICD-10-CM

## 2019-08-13 DIAGNOSIS — Z1159 Encounter for screening for other viral diseases: Secondary | ICD-10-CM

## 2019-08-13 DIAGNOSIS — E559 Vitamin D deficiency, unspecified: Secondary | ICD-10-CM

## 2019-08-13 DIAGNOSIS — I152 Hypertension secondary to endocrine disorders: Secondary | ICD-10-CM

## 2019-08-13 LAB — POCT GLYCOSYLATED HEMOGLOBIN (HGB A1C): Hemoglobin A1C: 9.3 % — AB (ref 4.0–5.6)

## 2019-08-13 MED ORDER — AMLODIPINE BESYLATE 5 MG PO TABS: 5.0000 mg | ORAL_TABLET | Freq: Every day | ORAL | 1 refills | Status: DC

## 2019-08-13 MED ORDER — TOUJEO SOLOSTAR 300 UNIT/ML ~~LOC~~ SOPN
14.0000 [IU] | PEN_INJECTOR | Freq: Every day | SUBCUTANEOUS | 5 refills | Status: DC
Start: 2019-08-13 — End: 2020-02-25

## 2019-08-13 MED ORDER — DAPAGLIFLOZIN PROPANEDIOL 5 MG PO TABS: 5.0000 mg | ORAL_TABLET | Freq: Every day | ORAL | 0 refills | Status: DC

## 2019-08-13 NOTE — Progress Notes (Signed)
Subjective:    Patient ID: Jane Lopez, female    DOB: [birth year], 59 y.o.   MRN: [id number]  HPI Chief Complaint  Patient presents with  . not fasting    not fasting cpe, no concerns, phq- 9 abnormal    She is here for a complete physical exam and to follow up on chronic health conditions.  Last CPE: years ago   Diabetes- last Hgb A1c 11.8 on 05/07/2018 and no follow up since.  States she had GI upset with the metformin so stopped it but did not let me know. States she has been taking Toujeo 10 units daily.  States FBS between 109-157.   States she has been improving her diet. Has lost 20 lbs. Dancing more at home.   Elevated LDL - states she is taking atorvastatin daily without any issues.   Uncontrolled HTN- states she takes losartan-HCTZ 100-25 mg daily.   Reports occasionally having blurred vision. Last eye exam in 2020. Dr. Einar Gip.   Smoking 1/2 pack per since she was a teenager.   Has hot flashes.    Social history: Lives with her mother, divorced, 3 adult kids, works as a Nutritional therapist  Denies drinking alcohol, drug use    Immunizations: received 2nd Covid vaccine 3 days ago. Overdue for Tdap and pneumonia.   Health maintenance:  Mammogram: years ago  Colonoscopy: never  Last Gynecological Exam: hysterectomy years ago.  Last Menstrual cycle: hyst  DEXA: never  Last Dental Exam: years ago  Last Eye Exam: 2020   Wears seatbelt always, smoke detectors in home and functioning, does not text while driving and feels safe in home environment.   Reviewed allergies, medications, past medical, surgical, family, and social history.   Review of Systems Review of Systems Constitutional: -fever, -chills, -sweats, -unexpected weight change,-fatigue ENT: -runny nose, -ear pain, -sore throat Cardiology:  -chest pain, -palpitations, -edema Respiratory: -cough, -shortness of breath, -wheezing Gastroenterology: -abdominal pain, -nausea, -vomiting, -diarrhea,  -constipation  Hematology: -bleeding or bruising problems Musculoskeletal: -arthralgias, -myalgias, -joint swelling, -back pain Ophthalmology: -vision changes Urology: -dysuria, -difficulty urinating, -hematuria, -urinary frequency, -urgency Neurology: -headache, -weakness, -tingling, -numbness       Objective:   Physical Exam BP (!) 194/92   Pulse (!) 55   Ht 5\' 8"  (1.727 m)   Wt 269 lb 3.2 oz (122.1 kg)   BMI 40.93 kg/m   General Appearance:    Alert, cooperative, no distress, appears stated age  Head:    Normocephalic, without obvious abnormality, atraumatic  Eyes:    PERRL, conjunctiva/corneas clear, EOM's intact  Ears:    Normal TM's and external ear canals  Nose:   Mask in place   Throat:   Mask in place   Neck:   Supple, no lymphadenopathy;  thyroid:  no   enlargement/tenderness/nodules; no JVD  Back:    Spine nontender, no curvature, ROM normal, no CVA     tenderness  Lungs:     Clear to auscultation bilaterally without wheezes, rales or     ronchi; respirations unlabored  Chest Wall:    No tenderness or deformity   Heart:    Regular rate and rhythm, S1 and S2 normal, no murmur, rub   or gallop  Breast Exam:    No tenderness, masses, or nipple discharge or inversion.      No axillary lymphadenopathy  Abdomen:     Soft, non-tender, nondistended, normoactive bowel sounds,    no masses, no hepatosplenomegaly  Genitalia:  Normal external genitalia without lesions.  BUS and vagina normal. No abnormal vaginal discharge.  Adnexa not enlarged, nontender.  Chaperone present      Extremities:   No clubbing, cyanosis or edema  Pulses:   2+ and symmetric all extremities  Skin:   Skin color, texture, turgor normal, no rashes or lesions  Lymph nodes:   Cervical, supraclavicular, and axillary nodes normal  Neurologic:   CNII-XII intact, normal strength, sensation and gait; reflexes 2+ and symmetric throughout          Psych:   Normal mood, affect, hygiene and grooming.           Assessment & Plan:  Routine general medical examination at a health care facility - Plan: CBC with Differential/Platelet, POCT Urinalysis DIP (Proadvantage Device), TSH, T4, free, T3, Lipid panel -She is here for a CPE.  Preventive health care reviewed and updated.  She is overdue for mammogram.  Has never had a DEXA or colonoscopy.  Referral made to GI.  She will call and schedule her mammogram and bone density. Immunizations reviewed.  She is overdue for Tdap and pneumonia vaccine however she just received her second dose of the Moderna Covid vaccine 3 days ago.  She is aware that she should return for nurse visit to get her other vaccines. Counseling on healthy lifestyle including diet and exercise and smoking cessation.  Uncontrolled type 2 diabetes mellitus with hyperglycemia ([place]) - Plan: HgB A1c, CBC with Differential/Platelet, Comprehensive metabolic panel, Microalbumin / creatinine urine ratio, POCT Urinalysis DIP (Proadvantage Device), EKG 12-Lead, dapagliflozin propanediol (FARXIGA) 5 MG TABS tablet, insulin glargine, 1 Unit Dial, (TOUJEO SOLOSTAR) 300 UNIT/ML Solostar Pen -She has not followed up for her diabetes since February 2020 when her A1c was greater than 11%.  Today her A1c is 9.3% and her diabetes is still uncontrolled.  She stopped taking on her own and did not let me know.  She sporadically checks blood sugars but did not bring in her meter readings today. She will discontinue Metformin due to GI upset.  Continue Toujeo but increase her dose from 10 units to 14 units daily.  Farxiga prescription sent to her pharmacy. Potential long-term health consequences associated with uncontrolled diabetes. Call and schedule a diabetic eye exam. Diabetic foot exam done. Urine microalbumin ordered. Counseling on cutting back on sugar and carbohydrates. Encouraged her to check her blood sugars regularly and follow-up in 4 weeks.  Hypertension associated with diabetes ([place]) - Plan: CBC with  Differential/Platelet, Comprehensive metabolic panel, EKG 99-IPJA, amLODipine (NORVASC) 5 MG tablet -Blood pressure is uncontrolled.  I will add amlodipine 5 mg.  Counseling on low-sodium diet, smoking cessation creasing physical activity.  Estrogen deficiency - Plan: DG Bone Density She will call and schedule bone density.  Screen for colon cancer - Plan: Ambulatory referral to Gastroenterology -GI referral made.  This will be her first colonoscopy.  Hot flashes due to menopause  Encounter for breast cancer screening using non-mammogram modality - Plan: MM DIGITAL SCREENING BILATERAL -She will call and schedule her mammogram  Vitamin D deficiency - Plan: VITAMIN D 25 Hydroxy (Vit-D Deficiency, Fractures) -Check vitamin D level and follow-up.  Need for hepatitis C screening test - Plan: Hepatitis C antibody -Done per guidelines  Screening for heart disease - Plan: EKG 12-Lead -Discussed multiple risk factors for heart disease including uncontrolled diabetes uncontrolled hypertension, smoking, obesity ECG shows sinus bradycardia, rate 53, nonspecific t wave abnormality. Read by myself and Dr. Redmond School.   Elevated LDL  cholesterol level - Plan: Lipid panel -Continue statin therapy.  Follow-up lipid panel results

## 2019-08-13 NOTE — Patient Instructions (Addendum)
Call and schedule your mammogram and bone density test.  Make sure they know you just received your second dose of the Covid vaccine.  You are also due for your pneumonia vaccine and tetanus vaccine.  It is not recommended that you get these within 2 weeks of your last Covid vaccine however.  You may call to schedule a nurse visit to get these.  Call and schedule a diabetic eye exam.  Call and schedule with a dentist.  You will hear from [facility] gastroenterology to schedule a visit.   Your hemoglobin A1c is 9.3% today and your diabetes is still uncontrolled.  I am starting you on a new medication called [place].  Take this once daily. Continue on Toujeo but increase your dose to 14 units daily.  Check your blood sugar twice daily for the next 4 weeks.  Check fasting blood sugar and 2 hours after a meal.  Keep a record of your blood sugar readings to bring in to your follow-up visit.  Your blood pressure is also significantly elevated.  Continue on your current blood pressure medications as well as start taking amlodipine 5 mg.  I sent this to your pharmacy.  I recommend that you eat a low-sodium diet.  I also recommend that you cut back on sweets and carbohydrates including potatoes, bread, pasta, rice and sugar in general.  Try to get at least 150 minutes of physical activity each week.  I will see you back in 4 weeks or sooner if needed.      Preventive Care 48-63 Years Old, Female Preventive care refers to visits with your health care provider and lifestyle choices that can promote health and wellness. This includes:  A yearly physical exam. This may also be called an annual well check.  Regular dental visits and eye exams.  Immunizations.  Screening for certain conditions.  Healthy lifestyle choices, such as eating a healthy diet, getting regular exercise, not using drugs or products that contain nicotine and tobacco, and limiting alcohol use. What can I expect for my  preventive care visit? Physical exam Your health care provider will check your:  Height and weight. This may be used to calculate body mass index (BMI), which tells if you are at a healthy weight.  Heart rate and blood pressure.  Skin for abnormal spots. Counseling Your health care provider may ask you questions about your:  Alcohol, tobacco, and drug use.  Emotional well-being.  Home and relationship well-being.  Sexual activity.  Eating habits.  Work and work Statistician.  Method of birth control.  Menstrual cycle.  Pregnancy history. What immunizations do I need?  Influenza (flu) vaccine  This is recommended every year. Tetanus, diphtheria, and pertussis (Tdap) vaccine  You may need a Td booster every 10 years. Varicella (chickenpox) vaccine  You may need this if you have not been vaccinated. Zoster (shingles) vaccine  You may need this after age 30. Measles, mumps, and rubella (MMR) vaccine  You may need at least one dose of MMR if you were born in 1957 or later. You may also need a second dose. Pneumococcal conjugate (PCV13) vaccine  You may need this if you have certain conditions and were not previously vaccinated. Pneumococcal polysaccharide (PPSV23) vaccine  You may need one or two doses if you smoke cigarettes or if you have certain conditions. Meningococcal conjugate (MenACWY) vaccine  You may need this if you have certain conditions. Hepatitis A vaccine  You may need this if you have certain  conditions or if you travel or work in places where you may be exposed to hepatitis A. Hepatitis B vaccine  You may need this if you have certain conditions or if you travel or work in places where you may be exposed to hepatitis B. Haemophilus influenzae type b (Hib) vaccine  You may need this if you have certain conditions. Human papillomavirus (HPV) vaccine  If recommended by your health care provider, you may need three doses over 6 months. You  may receive vaccines as individual doses or as more than one vaccine together in one shot (combination vaccines). Talk with your health care provider about the risks and benefits of combination vaccines. What tests do I need? Blood tests  Lipid and cholesterol levels. These may be checked every 5 years, or more frequently if you are over 22 years old.  Hepatitis C test.  Hepatitis B test. Screening  Lung cancer screening. You may have this screening every year starting at age 74 if you have a 30-pack-year history of smoking and currently smoke or have quit within the past 15 years.  Colorectal cancer screening. All adults should have this screening starting at age 78 and continuing until age 29. Your health care provider may recommend screening at age 62 if you are at increased risk. You will have tests every 1-10 years, depending on your results and the type of screening test.  Diabetes screening. This is done by checking your blood sugar (glucose) after you have not eaten for a while (fasting). You may have this done every 1-3 years.  Mammogram. This may be done every 1-2 years. Talk with your health care provider about when you should start having regular mammograms. This may depend on whether you have a family history of breast cancer.  BRCA-related cancer screening. This may be done if you have a family history of breast, ovarian, tubal, or peritoneal cancers.  Pelvic exam and Pap test. This may be done every 3 years starting at age 36. Starting at age 23, this may be done every 5 years if you have a Pap test in combination with an HPV test. Other tests  Sexually transmitted disease (STD) testing.  Bone density scan. This is done to screen for osteoporosis. You may have this scan if you are at high risk for osteoporosis. Follow these instructions at home: Eating and drinking  Eat a diet that includes fresh fruits and vegetables, whole grains, lean protein, and low-fat  dairy.  Take vitamin and mineral supplements as recommended by your health care provider.  Do not drink alcohol if: ? Your health care provider tells you not to drink. ? You are pregnant, may be pregnant, or are planning to become pregnant.  If you drink alcohol: ? Limit how much you have to 0-1 drink a day. ? Be aware of how much alcohol is in your drink. In the [place] one drink equals one 12 oz bottle of beer (355 mL), one 5 oz glass of wine (148 mL), or one 1 oz glass of hard liquor (44 mL). Lifestyle  Take daily care of your teeth and gums.  Stay active. Exercise for at least 30 minutes on 5 or more days each week.  Do not use any products that contain nicotine or tobacco, such as cigarettes, e-cigarettes, and chewing tobacco. If you need help quitting, ask your health care provider.  If you are sexually active, practice safe sex. Use a condom or other form of birth control (contraception) in order  to prevent pregnancy and STIs (sexually transmitted infections).  If told by your health care provider, take low-dose aspirin daily starting at age 17. What's next?  Visit your health care provider once a year for a well check visit.  Ask your health care provider how often you should have your eyes and teeth checked.  Stay up to date on all vaccines. This information is not intended to replace advice given to you by your health care provider. Make sure you discuss any questions you have with your health care provider. Document Revised: 11/06/2017 Document Reviewed: 11/06/2017 Elsevier Patient Education  2020 [employer].

## 2019-08-14 LAB — VITAMIN D 25 HYDROXY (VIT D DEFICIENCY, FRACTURES): Vit D, 25-Hydroxy: 10.3 ng/mL — ABNORMAL LOW (ref 30.0–100.0)

## 2019-08-14 LAB — CBC WITH DIFFERENTIAL/PLATELET
Basophils Absolute: 0.1 10*3/uL (ref 0.0–0.2)
Basos: 1 %
EOS (ABSOLUTE): 0.3 10*3/uL (ref 0.0–0.4)
Eos: 4 %
Hematocrit: 40 % (ref 34.0–46.6)
Hemoglobin: 12.9 g/dL (ref 11.1–15.9)
Immature Grans (Abs): 0 10*3/uL (ref 0.0–0.1)
Immature Granulocytes: 0 %
Lymphocytes Absolute: 2.9 10*3/uL (ref 0.7–3.1)
Lymphs: 44 %
MCH: 27 pg (ref 26.6–33.0)
MCHC: 32.3 g/dL (ref 31.5–35.7)
MCV: 84 fL (ref 79–97)
Monocytes Absolute: 0.5 10*3/uL (ref 0.1–0.9)
Monocytes: 8 %
Neutrophils Absolute: 2.9 10*3/uL (ref 1.4–7.0)
Neutrophils: 43 %
Platelets: 215 10*3/uL (ref 150–450)
RBC: 4.77 x10E6/uL (ref 3.77–5.28)
RDW: 13.8 % (ref 11.7–15.4)
WBC: 6.6 10*3/uL (ref 3.4–10.8)

## 2019-08-14 LAB — COMPREHENSIVE METABOLIC PANEL
ALT: 9 IU/L (ref 0–32)
AST: 10 IU/L (ref 0–40)
Albumin/Globulin Ratio: 1.8 (ref 1.2–2.2)
Albumin: 4.2 g/dL (ref 3.8–4.9)
Alkaline Phosphatase: 92 IU/L (ref 48–121)
BUN/Creatinine Ratio: 18 (ref 9–23)
BUN: 15 mg/dL (ref 6–24)
Bilirubin Total: 0.2 mg/dL (ref 0.0–1.2)
CO2: 24 mmol/L (ref 20–29)
Calcium: 9.6 mg/dL (ref 8.7–10.2)
Chloride: 100 mmol/L (ref 96–106)
Creatinine, Ser: 0.85 mg/dL (ref 0.57–1.00)
GFR calc Af Amer: 87 mL/min/{1.73_m2} (ref >59)
GFR calc non Af Amer: 76 mL/min/{1.73_m2} (ref >59)
Globulin, Total: 2.4 g/dL (ref 1.5–4.5)
Glucose: 273 mg/dL — ABNORMAL HIGH (ref 65–99)
Potassium: 4.4 mmol/L (ref 3.5–5.2)
Sodium: 138 mmol/L (ref 134–144)
Total Protein: 6.6 g/dL (ref 6.0–8.5)

## 2019-08-14 LAB — MICROALBUMIN / CREATININE URINE RATIO
Creatinine, Urine: 192.5 mg/dL
Microalb/Creat Ratio: 10 mg/g creat (ref 0–29)
Microalbumin, Urine: 19.8 ug/mL

## 2019-08-14 LAB — T3: T3, Total: 118 ng/dL (ref 71–180)

## 2019-08-14 LAB — LIPID PANEL
Chol/HDL Ratio: 4.4 ratio (ref 0.0–4.4)
Cholesterol, Total: 164 mg/dL (ref 100–199)
HDL: 37 mg/dL — ABNORMAL LOW (ref >39)
LDL Chol Calc (NIH): 113 mg/dL — ABNORMAL HIGH (ref 0–99)
Triglycerides: 72 mg/dL (ref 0–149)
VLDL Cholesterol Cal: 14 mg/dL (ref 5–40)

## 2019-08-14 LAB — TSH: TSH: 1.04 u[IU]/mL (ref 0.450–4.500)

## 2019-08-14 LAB — HEPATITIS C ANTIBODY: Hep C Virus Ab: <0.1 s/co ratio (ref 0.0–0.9)

## 2019-08-14 LAB — T4, FREE: Free T4: 1.21 ng/dL (ref 0.82–1.77)

## 2019-08-15 ENCOUNTER — Other Ambulatory Visit: Payer: Self-pay | Admitting: Family Medicine

## 2019-08-15 DIAGNOSIS — E1169 Type 2 diabetes mellitus with other specified complication: Secondary | ICD-10-CM

## 2019-08-15 DIAGNOSIS — E782 Mixed hyperlipidemia: Secondary | ICD-10-CM | POA: Insufficient documentation

## 2019-08-15 DIAGNOSIS — E559 Vitamin D deficiency, unspecified: Secondary | ICD-10-CM | POA: Insufficient documentation

## 2019-08-15 MED ORDER — VITAMIN D (ERGOCALCIFEROL) 1.25 MG (50000 UNIT) PO CAPS: 50000.0000 [IU] | ORAL_CAPSULE | ORAL | 0 refills | Status: DC

## 2019-08-15 NOTE — Progress Notes (Signed)
Her blood sugar was significantly elevated at 273 and her LDL or bad cholesterol is elevated. If she is taking her atorvastatin everyday, we may need to increase the dose from 10 mg to 20 mg daily. If she misses doses, I recommend that she take it daily. Her vitamin D level is severely low. I sent in a prescription vitamin D medication for her to take once weekly for the next 12 weeks. We will need to recheck her vitamin D level at her 3 months diabetes visit.

## 2019-08-16 ENCOUNTER — Other Ambulatory Visit: Payer: Self-pay | Admitting: Internal Medicine

## 2019-08-16 MED ORDER — ATORVASTATIN CALCIUM 20 MG PO TABS
20.0000 mg | ORAL_TABLET | Freq: Every day | ORAL | 0 refills | Status: DC
Start: 2019-08-16 — End: 2020-02-02

## 2019-08-18 ENCOUNTER — Telehealth (INDEPENDENT_AMBULATORY_CARE_PROVIDER_SITE_OTHER): Payer: BC Managed Care – PPO | Admitting: Family Medicine

## 2019-08-18 ENCOUNTER — Encounter: Payer: Self-pay | Admitting: Family Medicine

## 2019-08-18 ENCOUNTER — Telehealth: Payer: Self-pay | Admitting: Family Medicine

## 2019-08-18 VITALS — BP 192/92 | Wt 269.0 lb

## 2019-08-18 DIAGNOSIS — E1159 Type 2 diabetes mellitus with other circulatory complications: Secondary | ICD-10-CM | POA: Diagnosis not present

## 2019-08-18 DIAGNOSIS — R112 Nausea with vomiting, unspecified: Secondary | ICD-10-CM | POA: Diagnosis not present

## 2019-08-18 DIAGNOSIS — I10 Essential (primary) hypertension: Secondary | ICD-10-CM | POA: Diagnosis not present

## 2019-08-18 DIAGNOSIS — E1165 Type 2 diabetes mellitus with hyperglycemia: Secondary | ICD-10-CM | POA: Diagnosis not present

## 2019-08-18 DIAGNOSIS — I152 Hypertension secondary to endocrine disorders: Secondary | ICD-10-CM

## 2019-08-18 NOTE — Progress Notes (Signed)
   Subjective:  Documentation for virtual audio and video telecommunications through [facility] encounter:  The patient was located at home. 2 patient identifiers used.  The provider was located in the office. The patient did consent to this visit and is aware of possible charges through their insurance for this visit.  The other persons participating in this telemedicine service were none. Time spent on call was 11 minutes and in review of previous records 15 minutes total.  This virtual service is not related to other E/M service within previous 7 days.   Patient ID: Jane Lopez, female    DOB: 15-Sep-1960, 58 y.o.   MRN: [id number]  HPI Chief Complaint  Patient presents with  . vomitting    vomit yesterday due to DM med, dizzy and hot flash, did not take medicine today and feels better, been going on since taking med   Complains of nausea and 3 episodes of vomiting and dizziness since starting on Farxiga and amlodipine yesterday morning. States she did not take [place] today and her symptoms have resolved. She prefers to not take [place]. She has eaten today and did fine. Drinking plenty of fluids.   States her blood sugar was 129 today, 2 hours after breakfast. She is taking Toujeo 14 units but stopped metformin due to GI upset.   States she took amlodipine and her other HTN medication but her BP has not improved.  No fever, chills, chest pain, palpitations, shortness of breath, abdominal pain, diarrhea.     Review of Systems Pertinent positives and negatives in the history of present illness.     Objective:   Physical Exam BP (!) 192/92   Wt 269 lb (122 kg)   BMI 40.90 kg/m   Alert and oriented in no acute distress.  Respirations unlabored.  Normal speech, mood and thought process      Assessment & Plan:  Non-intractable vomiting with nausea, unspecified vomiting type  Uncontrolled type 2 diabetes mellitus with hyperglycemia ([place])  Hypertension associated  with diabetes ([place])  Discussed that taking her medications, especially her new medication Farxiga, on an empty stomach may have caused her symptoms.  She will hold off on Farxiga for now and keep an eye on her blood sugars.  Discussed potentially trying the medication again with breakfast in a few days if she would like.  She is reportedly back to baseline today. She did take her amlodipine today as well as losartan-hydrochlorothiazide.  Discussed that she just started on amlodipine and that it will take time for her blood pressure to respond.  She will follow-up with me as scheduled in 4 weeks or sooner if needed.

## 2019-08-18 NOTE — Telephone Encounter (Signed)
We should do a virtual visit to discuss all of this.

## 2019-08-18 NOTE — Telephone Encounter (Signed)
Pt is doing a virtual appt at 2.30 this afternoon

## 2019-08-18 NOTE — Telephone Encounter (Signed)
Pt called and states that she took all the new medicines that was prescribed to her at her last visit, states it made her have hot flashes, get dizzy nausea, and throw up, and she has to pee every 20 min, states, she is not for sure which medicine made her sick since she took all of them, pt can be reached at [phone] pt is not for sure what she needs to do, she states she only took her BP medicine this morning she did not take her diabetes this morning yet because her blood sugar was only 126

## 2019-09-10 ENCOUNTER — Ambulatory Visit: Payer: BC Managed Care – PPO | Admitting: Family Medicine

## 2019-09-16 ENCOUNTER — Other Ambulatory Visit: Payer: Self-pay

## 2019-09-16 ENCOUNTER — Ambulatory Visit (INDEPENDENT_AMBULATORY_CARE_PROVIDER_SITE_OTHER): Payer: BC Managed Care – PPO | Admitting: Family Medicine

## 2019-09-16 ENCOUNTER — Encounter: Payer: Self-pay | Admitting: Family Medicine

## 2019-09-16 VITALS — BP 190/74 | HR 74 | Wt 272.0 lb

## 2019-09-16 DIAGNOSIS — I10 Essential (primary) hypertension: Secondary | ICD-10-CM | POA: Diagnosis not present

## 2019-09-16 DIAGNOSIS — E1159 Type 2 diabetes mellitus with other circulatory complications: Secondary | ICD-10-CM | POA: Diagnosis not present

## 2019-09-16 DIAGNOSIS — E1165 Type 2 diabetes mellitus with hyperglycemia: Secondary | ICD-10-CM

## 2019-09-16 DIAGNOSIS — I152 Hypertension secondary to endocrine disorders: Secondary | ICD-10-CM

## 2019-09-16 NOTE — Progress Notes (Signed)
   Subjective:    Patient ID: Jane Lopez, female    DOB: 1960/07/22, 59 y.o.   MRN: [id number]  HPI Chief Complaint  Patient presents with  . follow-up    follow-up diabetes and htn. BS ranging all over the places. htn 170's/90s   She is here for a 4 week follow up on uncontrolled diabetes and HTN.  Prior to her CPE in June 2021, I had not seen her since March 2020 when her Hgb A1c was 11.8%  Uncontrolled diabetes- A1c 9.3% 4 weeks ago. She started on Farxiga but states after 2 days of taking it she started vomiting and had to leave work. Symptoms resolved after stopping it. She does not want to start back on this medication.  In the past metformin has caused GI upset so she stopped it.  States last night she started taking metformin again because her blood sugar was >300 and she was worried.  Reports taking Toujeo 14 units nightly.   Diet is fairly unhealthy.  She is smoking more than 1/2 pack currently.   States blood sugars at home have been : FBS 104- 170. States last night her BS was >300 FBS 277 this morning   Uncontrolled HTN- added amlodipine 5 mg 4 weeks ago and she reports she has been taking it without any concerns. Also reports taking losartan -HCTZ 100-25 mg States she does not add salt to her food but eats out a lot.   BP at home has been high   Denies fever, chills, dizziness, chest pain, palpitations, shortness of breath, abdominal pain, N/V/D, urinary symptoms, LE edema.     Review of Systems Pertinent positives and negatives in the history of present illness.     Objective:   Physical Exam BP (!) 190/74   Pulse 74   Wt 272 lb (123.4 kg)   SpO2 99%   BMI 41.36 kg/m   Alert oriented in no acute distress.  Not otherwise examined.      Assessment & Plan:  Uncontrolled type 2 diabetes mellitus with hyperglycemia ([place]) - Plan: Ambulatory referral to Endocrinology  Hypertension associated with diabetes [facility])  Reviewed labs from previous visit  4 weeks ago. Apparently her diabetes has not ever been well controlled. She stopped metformin in the past due to GI upset but she started back on it last night on her own.  States she has plenty of these at home.  Wilder Glade was prescribed 4 weeks ago but after 2 days she stopped it due to having GI upset.  She is reportedly taking Toujeo daily without any issues. I will refer her to endocrinology for further assistance with getting her diabetes under better control and she is agreeable.  HTN- continue medication regimen for now. Recommend she stop smoking and eat a low sodium diet. If her BP does not continue to improve, we will increase her amlodipine to 10 mg.

## 2019-10-11 ENCOUNTER — Other Ambulatory Visit: Payer: Self-pay | Admitting: Family Medicine

## 2019-10-11 DIAGNOSIS — Z20822 Contact with and (suspected) exposure to covid-19: Secondary | ICD-10-CM | POA: Diagnosis not present

## 2019-10-11 DIAGNOSIS — I10 Essential (primary) hypertension: Secondary | ICD-10-CM

## 2019-10-11 DIAGNOSIS — E1159 Type 2 diabetes mellitus with other circulatory complications: Secondary | ICD-10-CM

## 2019-10-15 ENCOUNTER — Other Ambulatory Visit: Payer: Self-pay

## 2019-10-15 ENCOUNTER — Encounter: Payer: Self-pay | Admitting: Gastroenterology

## 2019-10-15 ENCOUNTER — Ambulatory Visit (AMBULATORY_SURGERY_CENTER): Payer: Self-pay

## 2019-10-15 VITALS — Ht 68.0 in | Wt 264.0 lb

## 2019-10-15 DIAGNOSIS — Z1211 Encounter for screening for malignant neoplasm of colon: Secondary | ICD-10-CM

## 2019-10-15 MED ORDER — SUTAB 1479-225-188 MG PO TABS: 1.0000 | ORAL_TABLET | ORAL | 0 refills | Status: DC

## 2019-10-15 NOTE — Progress Notes (Signed)
No egg or soy allergy known to patient  No issues with past sedation with any surgeries or procedures No intubation problems in the past  No FH of Malignant Hyperthermia No diet pills per patient No home 02 use per patient  No blood thinners per patient  Pt denies issues with constipation  No A fib or A flutter  COVID 19 guidelines implemented in [facility] today with Pt and RN  Coupon given to pt in [facility] today , Code to Pharmacy  COVID vaccines completed on 08/2019 per pt;  Due to the COVID-19 pandemic we are asking patients to follow these guidelines. Please only bring one care partner. Please be aware that your care partner may wait in the car in the parking lot or if they feel like they will be too hot to wait in the car, they may wait in the lobby on the 4th floor. All care partners are required to wear a mask the entire time (we do not have any that we can provide them), they need to practice social distancing, and we will do a Covid check for all patient's and care partners when you arrive. Also we will check their temperature and your temperature. If the care partner waits in their car they need to stay in the parking lot the entire time and we will call them on their cell phone when the patient is ready for discharge so they can bring the car to the front of the building. Also all patient's will need to wear a mask into building.  

## 2019-10-22 ENCOUNTER — Ambulatory Visit
Admission: RE | Admit: 2019-10-22 | Discharge: 2019-10-22 | Disposition: A | Discharge status: Home / Self Care | Payer: BC Managed Care – PPO | Source: Ambulatory Visit | Attending: Family Medicine | Admitting: Family Medicine

## 2019-10-22 ENCOUNTER — Other Ambulatory Visit: Payer: Self-pay

## 2019-10-22 DIAGNOSIS — E2839 Other primary ovarian failure: Secondary | ICD-10-CM

## 2019-10-22 DIAGNOSIS — Z780 Asymptomatic menopausal state: Secondary | ICD-10-CM | POA: Diagnosis not present

## 2019-10-22 DIAGNOSIS — Z1231 Encounter for screening mammogram for malignant neoplasm of breast: Secondary | ICD-10-CM | POA: Diagnosis not present

## 2019-10-22 DIAGNOSIS — Z13820 Encounter for screening for osteoporosis: Secondary | ICD-10-CM | POA: Diagnosis not present

## 2019-10-22 DIAGNOSIS — Z1239 Encounter for other screening for malignant neoplasm of breast: Secondary | ICD-10-CM

## 2019-10-22 NOTE — Progress Notes (Signed)
Her bone density test is normal.

## 2019-10-28 ENCOUNTER — Encounter: Payer: Self-pay | Admitting: Certified Registered Nurse Anesthetist

## 2019-10-29 ENCOUNTER — Other Ambulatory Visit: Payer: Self-pay

## 2019-10-29 ENCOUNTER — Ambulatory Visit (AMBULATORY_SURGERY_CENTER): Payer: BC Managed Care – PPO | Admitting: Gastroenterology

## 2019-10-29 ENCOUNTER — Encounter: Payer: Self-pay | Admitting: Gastroenterology

## 2019-10-29 VITALS — BP 174/76 | HR 45 | Temp 96.8°F | Resp 10 | Ht 68.0 in | Wt 264.0 lb

## 2019-10-29 DIAGNOSIS — D126 Benign neoplasm of colon, unspecified: Secondary | ICD-10-CM | POA: Diagnosis not present

## 2019-10-29 DIAGNOSIS — Z1211 Encounter for screening for malignant neoplasm of colon: Secondary | ICD-10-CM

## 2019-10-29 DIAGNOSIS — D122 Benign neoplasm of ascending colon: Secondary | ICD-10-CM | POA: Diagnosis not present

## 2019-10-29 DIAGNOSIS — D123 Benign neoplasm of transverse colon: Secondary | ICD-10-CM

## 2019-10-29 MED ORDER — SODIUM CHLORIDE 0.9 % IV SOLN: 500.0000 mL | Freq: Once | INTRAVENOUS | Status: DC

## 2019-10-29 NOTE — Progress Notes (Signed)
Called to room to assist during endoscopic procedure.  Patient ID and intended procedure confirmed with present staff. Received instructions for my participation in the procedure from the performing physician.  

## 2019-10-29 NOTE — Patient Instructions (Signed)
Information on polyps and hemorrhoids given to you today.  Await pathology results.  Continue present diet and medications.  High fiber diet is recommended.  YOU HAD AN ENDOSCOPIC PROCEDURE TODAY AT [facility]:   Refer to the procedure report that was given to you for any specific questions about what was found during the examination.  If the procedure report does not answer your questions, please call your gastroenterologist to clarify.  If you requested that your care partner not be given the details of your procedure findings, then the procedure report has been included in a sealed envelope for you to review at your convenience later.  YOU SHOULD EXPECT: Some feelings of bloating in the abdomen. Passage of more gas than usual.  Walking can help get rid of the air that was put into your GI tract during the procedure and reduce the bloating. If you had a lower endoscopy (such as a colonoscopy or flexible sigmoidoscopy) you may notice spotting of blood in your stool or on the toilet paper. If you underwent a bowel prep for your procedure, you may not have a normal bowel movement for a few days.  Please Note:  You might notice some irritation and congestion in your nose or some drainage.  This is from the oxygen used during your procedure.  There is no need for concern and it should clear up in a day or so.  SYMPTOMS TO REPORT IMMEDIATELY:   Following lower endoscopy (colonoscopy or flexible sigmoidoscopy):  Excessive amounts of blood in the stool  Significant tenderness or worsening of abdominal pains  Swelling of the abdomen that is new, acute  Fever of 100F or higher   For urgent or emergent issues, a gastroenterologist can be reached at any hour by calling [phone]. Do not use MyChart messaging for urgent concerns.    DIET:  We do recommend a small meal at first, but then you may proceed to your regular diet.  Drink plenty of fluids but you should avoid alcoholic  beverages for 24 hours.  ACTIVITY:  You should plan to take it easy for the rest of today and you should NOT DRIVE or use heavy machinery until tomorrow (because of the sedation medicines used during the test).    FOLLOW UP: Our staff will call the number listed on your records 48-72 hours following your procedure to check on you and address any questions or concerns that you may have regarding the information given to you following your procedure. If we do not reach you, we will leave a message.  We will attempt to reach you two times.  During this call, we will ask if you have developed any symptoms of COVID 19. If you develop any symptoms (ie: fever, flu-like symptoms, shortness of breath, cough etc.) before then, please call [phone].  If you test positive for Covid 19 in the 2 weeks post procedure, please call and report this information to [place].    If any biopsies were taken you will be contacted by phone or by letter within the next 1-3 weeks.  Please call us at [phone] if you have not heard about the biopsies in 3 weeks.    SIGNATURES/CONFIDENTIALITY: You and/or your care partner have signed paperwork which will be entered into your electronic medical record.  These signatures attest to the fact that that the information above on your After Visit Summary has been reviewed and is understood.  Full responsibility of the confidentiality of this  discharge information lies with you and/or your care-partner. 

## 2019-10-29 NOTE — Progress Notes (Signed)
Pt's states no medical or surgical changes since previsit or office visit. 

## 2019-10-29 NOTE — Op Note (Signed)
[facility] Patient Name: Jane Lopez Procedure Date: 10/29/2019 11:27 AM MRN: [id number] Endoscopist: Thornton Park MD, MD Age: 59 Referring MD:  Date of Birth: [birth year] Gender: Female Account #: [id number] Procedure:                Colonoscopy Indications:              Screening for colorectal malignant neoplasm, This                            is the patient's first colonoscopy                           No known family history of colon cancer or polyps Medicines:                Monitored Anesthesia Care Procedure:                Pre-Anesthesia Assessment:                           - Prior to the procedure, a History and Physical                            was performed, and patient medications and                            allergies were reviewed. The patient's tolerance of                            previous anesthesia was also reviewed. The risks                            and benefits of the procedure and the sedation                            options and risks were discussed with the patient.                            All questions were answered, and informed consent                            was obtained. Prior Anticoagulants: The patient has                            taken no previous anticoagulant or antiplatelet                            agents. ASA Grade Assessment: III - A patient with                            severe systemic disease. After reviewing the risks                            and benefits, the patient was deemed in  satisfactory condition to undergo the procedure.                           After obtaining informed consent, the colonoscope                            was passed under direct vision. Throughout the                            procedure, the patient's blood pressure, pulse, and                            oxygen saturations were monitored continuously. The                            Colonoscope  was introduced through the anus and                            advanced to the 3 cm into the ileum. A second                            forward view of the right colon was performed. The                            colonoscopy was performed without difficulty. The                            patient tolerated the procedure well. The quality                            of the bowel preparation was good. The terminal                            ileum, ileocecal valve, appendiceal orifice, and                            rectum were photographed. Scope In: 11:39:01 AM Scope Out: 11:58:13 AM Scope Withdrawal Time: 0 hours 16 minutes 49 seconds  Total Procedure Duration: 0 hours 19 minutes 12 seconds  Findings:                 The perianal and digital rectal examinations were                            normal.                           A few small-mouthed diverticula were found in the                            sigmoid colon.                           Three sessile polyps were found in the splenic  flexure, hepatic flexure and ascending colon. The                            polyps were 1 to 2 mm in size. These polyps were                            removed with a cold snare. Resection and retrieval                            were complete. Estimated blood loss was minimal.                           Non-bleeding internal hemorrhoids were found.                           The exam was otherwise without abnormality on                            direct and retroflexion views. Complications:            No immediate complications. Estimated blood loss:                            Minimal. Estimated Blood Loss:     Estimated blood loss was minimal. Impression:               - Diverticulosis in the sigmoid colon.                           - Three 1 to 2 mm polyps at the splenic flexure, at                            the hepatic flexure and in the ascending colon,                             removed with a cold snare. Resected and retrieved.                           - Non-bleeding internal hemorrhoids.                           - The examination was otherwise normal on direct                            and retroflexion views. Recommendation:           - Patient has a contact number available for                            emergencies. The signs and symptoms of potential                            delayed complications were discussed with the  patient. Return to normal activities tomorrow.                            Written discharge instructions were provided to the                            patient.                           - Resume previous diet. High fiber diet recommended.                           - Continue present medications.                           - Await pathology results.                           - Repeat colonoscopy date to be determined after                            pending pathology results are reviewed for                            surveillance.                           - Emerging evidence supports eating a diet of                            fruits, vegetables, grains, calcium, and yogurt                            while reducing red meat and alcohol may reduce the                            risk of colon cancer.                           - Thank you for allowing me to be involved in your                            colon cancer prevention. Thornton Park MD, MD 10/29/2019 12:04:57 PM This report has been signed electronically.

## 2019-10-29 NOTE — Progress Notes (Signed)
VS taken by C.W. 

## 2019-11-02 ENCOUNTER — Telehealth: Payer: Self-pay

## 2019-11-02 ENCOUNTER — Encounter: Payer: Self-pay | Admitting: Gastroenterology

## 2019-11-02 NOTE — Telephone Encounter (Signed)
  Follow up Call-  Call back number 10/29/2019  Post procedure Call Back phone  # [phone]  Permission to leave phone message Yes  Some recent data might be hidden     Patient questions:  Do you have a fever, pain , or abdominal swelling? No. Pain Score  0 *  Have you tolerated food without any problems? Yes.    Have you been able to return to your normal activities? Yes.    Do you have any questions about your discharge instructions: Diet   No. Medications  No. Follow up visit  No.  Do you have questions or concerns about your Care? No.  Actions: * If pain score is 4 or above: No action needed, pain <4.  1. Have you developed a fever since your procedure? no  2.   Have you had an respiratory symptoms (SOB or cough) since your procedure? no  3.   Have you tested positive for COVID 19 since your procedure no  4.   Have you had any family members/close contacts diagnosed with the COVID 19 since your procedure?  no   If yes to any of these questions please route to Joylene John, RN and Joella Prince, RN

## 2019-12-17 ENCOUNTER — Ambulatory Visit: Payer: BC Managed Care – PPO | Admitting: Family Medicine

## 2019-12-17 ENCOUNTER — Ambulatory Visit: Payer: Self-pay | Admitting: Internal Medicine

## 2020-02-01 ENCOUNTER — Telehealth: Payer: Self-pay

## 2020-02-01 NOTE — Telephone Encounter (Signed)
Needs her vitamin D level rechecked to determine appropriate dose going forward.

## 2020-02-01 NOTE — Telephone Encounter (Signed)
Pt advised. Pt does not have follow-up appt yet

## 2020-02-01 NOTE — Telephone Encounter (Signed)
Pt. Called LM stating she needs a refill on her Vit D pt. Last apt is 09/16/19.

## 2020-02-02 ENCOUNTER — Telehealth: Payer: Self-pay | Admitting: Family Medicine

## 2020-02-02 MED ORDER — ATORVASTATIN CALCIUM 20 MG PO TABS: 20.0000 mg | ORAL_TABLET | Freq: Every day | ORAL | 0 refills | Status: DC

## 2020-02-02 NOTE — Telephone Encounter (Signed)
sent 

## 2020-02-02 NOTE — Telephone Encounter (Signed)
Left message to call and follow-up with vickie for vitamin d

## 2020-02-02 NOTE — Telephone Encounter (Signed)
Pt called and states that she needs a refill on her lipitor pt would like it sent to the [facility], [place] [address]  Pt is in [place] and will not be back until after the new year Pt would like  A call after it is sent in so she will know if is sent in

## 2020-02-23 ENCOUNTER — Other Ambulatory Visit: Payer: Self-pay | Admitting: Family Medicine

## 2020-02-23 DIAGNOSIS — I10 Essential (primary) hypertension: Secondary | ICD-10-CM

## 2020-02-23 DIAGNOSIS — E1159 Type 2 diabetes mellitus with other circulatory complications: Secondary | ICD-10-CM

## 2020-02-23 NOTE — Telephone Encounter (Signed)
Pt has an appt coming up but I do not see you have filled metformin

## 2020-02-23 NOTE — Telephone Encounter (Signed)
I referred her to endocrinology for her diabetes. Her BP was not well controlled when I saw her last and she will need a follow up visit before I can refill her medication.

## 2020-02-23 NOTE — Telephone Encounter (Signed)
Pt was notified that we could not refill her metformin as she was sent to endo and she ran out today of bp and was advised she would have to wait until Friday for refill

## 2020-02-25 ENCOUNTER — Ambulatory Visit (INDEPENDENT_AMBULATORY_CARE_PROVIDER_SITE_OTHER): Payer: BC Managed Care – PPO | Admitting: Family Medicine

## 2020-02-25 ENCOUNTER — Encounter: Payer: Self-pay | Admitting: Family Medicine

## 2020-02-25 ENCOUNTER — Other Ambulatory Visit: Payer: Self-pay

## 2020-02-25 VITALS — BP 130/80 | HR 57 | Wt 268.4 lb

## 2020-02-25 DIAGNOSIS — T881XXA Other complications following immunization, not elsewhere classified, initial encounter: Secondary | ICD-10-CM

## 2020-02-25 DIAGNOSIS — E1165 Type 2 diabetes mellitus with hyperglycemia: Secondary | ICD-10-CM

## 2020-02-25 DIAGNOSIS — E559 Vitamin D deficiency, unspecified: Secondary | ICD-10-CM

## 2020-02-25 DIAGNOSIS — I152 Hypertension secondary to endocrine disorders: Secondary | ICD-10-CM | POA: Diagnosis not present

## 2020-02-25 DIAGNOSIS — F17200 Nicotine dependence, unspecified, uncomplicated: Secondary | ICD-10-CM

## 2020-02-25 DIAGNOSIS — E1159 Type 2 diabetes mellitus with other circulatory complications: Secondary | ICD-10-CM

## 2020-02-25 DIAGNOSIS — E1169 Type 2 diabetes mellitus with other specified complication: Secondary | ICD-10-CM | POA: Diagnosis not present

## 2020-02-25 DIAGNOSIS — I10 Essential (primary) hypertension: Secondary | ICD-10-CM

## 2020-02-25 DIAGNOSIS — R002 Palpitations: Secondary | ICD-10-CM | POA: Diagnosis not present

## 2020-02-25 DIAGNOSIS — E782 Mixed hyperlipidemia: Secondary | ICD-10-CM

## 2020-02-25 HISTORY — DX: Other complications following immunization, not elsewhere classified, initial encounter: T88.1XXA

## 2020-02-25 LAB — POCT URINALYSIS DIP (PROADVANTAGE DEVICE)
Bilirubin, UA: NEGATIVE
Blood, UA: NEGATIVE
Glucose, UA: >=1000 mg/dL — AB
Ketones, POC UA: NEGATIVE mg/dL
Leukocytes, UA: NEGATIVE
Nitrite, UA: NEGATIVE
Specific Gravity, Urine: 1.015
Urobilinogen, Ur: NEGATIVE
pH, UA: 6.5 (ref 5.0–8.0)

## 2020-02-25 MED ORDER — AMLODIPINE BESYLATE 5 MG PO TABS
5.0000 mg | ORAL_TABLET | Freq: Every day | ORAL | 1 refills | Status: DC
Start: 2020-02-25 — End: 2020-05-12

## 2020-02-25 MED ORDER — TOUJEO SOLOSTAR 300 UNIT/ML ~~LOC~~ SOPN: 16.0000 [IU] | PEN_INJECTOR | Freq: Every day | SUBCUTANEOUS | 0 refills | Status: DC

## 2020-02-25 MED ORDER — ATORVASTATIN CALCIUM 20 MG PO TABS: 20.0000 mg | ORAL_TABLET | Freq: Every day | ORAL | 1 refills | Status: DC

## 2020-02-25 MED ORDER — METFORMIN HCL 1000 MG PO TABS
1000.0000 mg | ORAL_TABLET | Freq: Two times a day (BID) | ORAL | 0 refills | Status: DC
Start: 2020-02-25 — End: 2020-05-12

## 2020-02-25 MED ORDER — LOSARTAN POTASSIUM-HCTZ 100-25 MG PO TABS: 1.0000 | ORAL_TABLET | Freq: Every day | ORAL | 1 refills | Status: DC

## 2020-02-25 NOTE — Patient Instructions (Signed)
Take metformin twice daily.  Increase Toujeo to 16 units daily.   I am putting in a new referral for you to see [facility]. They will call you to schedule.   Start taking a multi-vitamin. Women's One A Day.   Eat a low sodium, carbohydrate and low fat diet.   Stop smoking.   I will be in touch with your lab results.    DASH Eating Plan DASH stands for "Dietary Approaches to Stop Hypertension." The DASH eating plan is a healthy eating plan that has been shown to reduce high blood pressure (hypertension). It may also reduce your risk for type 2 diabetes, heart disease, and stroke. The DASH eating plan may also help with weight loss. What are tips for following this plan?  General guidelines  Avoid eating more than 2,300 mg (milligrams) of salt (sodium) a day. If you have hypertension, you may need to reduce your sodium intake to 1,500 mg a day.  Limit alcohol intake to no more than 1 drink a day for nonpregnant women and 2 drinks a day for men. One drink equals 12 oz of beer, 5 oz of wine, or 1 oz of hard liquor.  Work with your health care provider to maintain a healthy body weight or to lose weight. Ask what an ideal weight is for you.  Get at least 30 minutes of exercise that causes your heart to beat faster (aerobic exercise) most days of the week. Activities may include walking, swimming, or biking.  Work with your health care provider or diet and nutrition specialist (dietitian) to adjust your eating plan to your individual calorie needs. Reading food labels   Check food labels for the amount of sodium per serving. Choose foods with less than 5 percent of the Daily Value of sodium. Generally, foods with less than 300 mg of sodium per serving fit into this eating plan.  To find whole grains, look for the word "whole" as the first word in the ingredient list. Shopping  Buy products labeled as "low-sodium" or "no salt added."  Buy fresh foods. Avoid canned foods  and premade or frozen meals. Cooking  Avoid adding salt when cooking. Use salt-free seasonings or herbs instead of table salt or sea salt. Check with your health care provider or pharmacist before using salt substitutes.  Do not fry foods. Cook foods using healthy methods such as baking, boiling, grilling, and broiling instead.  Cook with heart-healthy oils, such as olive, canola, soybean, or sunflower oil. Meal planning  Eat a balanced diet that includes: ? 5 or more servings of fruits and vegetables each day. At each meal, try to fill half of your plate with fruits and vegetables. ? Up to 6-8 servings of whole grains each day. ? Less than 6 oz of lean meat, poultry, or fish each day. A 3-oz serving of meat is about the same size as a deck of cards. One egg equals 1 oz. ? 2 servings of low-fat dairy each day. ? A serving of nuts, seeds, or beans 5 times each week. ? Heart-healthy fats. Healthy fats called Omega-3 fatty acids are found in foods such as flaxseeds and coldwater fish, like sardines, salmon, and mackerel.  Limit how much you eat of the following: ? Canned or prepackaged foods. ? Food that is high in trans fat, such as fried foods. ? Food that is high in saturated fat, such as fatty meat. ? Sweets, desserts, sugary drinks, and other foods with added sugar. ?  Full-fat dairy products.  Do not salt foods before eating.  Try to eat at least 2 vegetarian meals each week.  Eat more home-cooked food and less restaurant, buffet, and fast food.  When eating at a restaurant, ask that your food be prepared with less salt or no salt, if possible. What foods are recommended? The items listed may not be a complete list. Talk with your dietitian about what dietary choices are best for you. Grains Whole-grain or whole-wheat bread. Whole-grain or whole-wheat pasta. Brown rice. [place]row. Bulgur. Whole-grain and low-sodium cereals. Pita bread. Low-fat, low-sodium crackers.  Whole-wheat flour tortillas. Vegetables Fresh or frozen vegetables (raw, steamed, roasted, or grilled). Low-sodium or reduced-sodium tomato and vegetable juice. Low-sodium or reduced-sodium tomato sauce and tomato paste. Low-sodium or reduced-sodium canned vegetables. Fruits All fresh, dried, or frozen fruit. Canned fruit in natural juice (without added sugar). Meat and other protein foods Skinless chicken or [place]. Ground chicken or [place]. Pork with fat trimmed off. Fish and seafood. Egg whites. Dried beans, peas, or lentils. Unsalted nuts, nut butters, and seeds. Unsalted canned beans. Lean cuts of beef with fat trimmed off. Low-sodium, lean deli meat. Dairy Low-fat (1%) or fat-free (skim) milk. Fat-free, low-fat, or reduced-fat cheeses. Nonfat, low-sodium ricotta or cottage cheese. Low-fat or nonfat yogurt. Low-fat, low-sodium cheese. Fats and oils Soft margarine without trans fats. Vegetable oil. Low-fat, reduced-fat, or light mayonnaise and salad dressings (reduced-sodium). Canola, safflower, olive, soybean, and sunflower oils. Avocado. Seasoning and other foods Herbs. Spices. Seasoning mixes without salt. Unsalted popcorn and pretzels. Fat-free sweets. What foods are not recommended? The items listed may not be a complete list. Talk with your dietitian about what dietary choices are best for you. Grains Baked goods made with fat, such as croissants, muffins, or some breads. Dry pasta or rice meal packs. Vegetables Creamed or fried vegetables. Vegetables in a cheese sauce. Regular canned vegetables (not low-sodium or reduced-sodium). Regular canned tomato sauce and paste (not low-sodium or reduced-sodium). Regular tomato and vegetable juice (not low-sodium or reduced-sodium). Angie Fava. Olives. Fruits Canned fruit in a light or heavy syrup. Fried fruit. Fruit in cream or butter sauce. Meat and other protein foods Fatty cuts of meat. Ribs. Fried meat. Berniece Salines. Sausage. Bologna and other  processed lunch meats. Salami. Fatback. Hotdogs. Bratwurst. Salted nuts and seeds. Canned beans with added salt. Canned or smoked fish. Whole eggs or egg yolks. Chicken or [place] with skin. Dairy Whole or 2% milk, cream, and half-and-half. Whole or full-fat cream cheese. Whole-fat or sweetened yogurt. Full-fat cheese. Nondairy creamers. Whipped toppings. Processed cheese and cheese spreads. Fats and oils Butter. Stick margarine. Lard. Shortening. Ghee. Bacon fat. Tropical oils, such as coconut, palm kernel, or palm oil. Seasoning and other foods Salted popcorn and pretzels. Onion salt, garlic salt, seasoned salt, table salt, and sea salt. Worcestershire sauce. Tartar sauce. Barbecue sauce. Teriyaki sauce. Soy sauce, including reduced-sodium. Steak sauce. Canned and packaged gravies. Fish sauce. Oyster sauce. Cocktail sauce. Horseradish that you find on the shelf. Ketchup. Mustard. Meat flavorings and tenderizers. Bouillon cubes. Hot sauce and Tabasco sauce. Premade or packaged marinades. Premade or packaged taco seasonings. Relishes. Regular salad dressings. Where to find more information:  National Heart, Lung, and [place]: https://wilson-eaton.com/  American Heart Association: www.heart.org Summary  The DASH eating plan is a healthy eating plan that has been shown to reduce high blood pressure (hypertension). It may also reduce your risk for type 2 diabetes, heart disease, and stroke.  With the DASH eating plan, you  should limit salt (sodium) intake to 2,300 mg a day. If you have hypertension, you may need to reduce your sodium intake to 1,500 mg a day.  When on the DASH eating plan, aim to eat more fresh fruits and vegetables, whole grains, lean proteins, low-fat dairy, and heart-healthy fats.  Work with your health care provider or diet and nutrition specialist (dietitian) to adjust your eating plan to your individual calorie needs. This information is not intended to replace advice given to  you by your health care provider. Make sure you discuss any questions you have with your health care provider. Document Revised: 02/07/2017 Document Reviewed: 02/19/2016 Elsevier Patient Education  2020 [employer].

## 2020-02-25 NOTE — Progress Notes (Signed)
Subjective:    Patient ID: Jane Lopez, female    DOB: 08/07/1960, 59 y.o.   MRN: [id number]  HPI Chief Complaint  Patient presents with  . Follow-up    Follow-up on HTN. Not been checking on bp   She is here for a follow-up on hypertension and other chronic health conditions. States she does not check her blood pressure at home. Reports taking her medication most days but may miss one day per week.  States she has cut back on sodium.   States she feels well.   Smoking 1/2 pack per day and is not ready to quit.  HL-reports taking Lipitor most days of the week but she does skip some doses.  Uncontrolled diabetes - I referred her to endocrinology for assistance getting her diabetes under control and states her appointment was canceled and then she canceled her next one so she was told she needs a new referral.  Fasting blood sugars this week have been from 129 to 160. She has had FBS as high as 300s but that is not often.   States she is taking metformin 500 mg once daily (did not know she was supposed to take it twice daily even though that is on the prescription) and Toujeo 14 units.   Vitamin D def-states she is not currently taking vitamin D.  She did take a course of high-dose vitamin D once weekly as prescribed but this ran out 2 to 3 months ago.  States she had an episode of her heart racing approximately 2 to 3 months ago.  States it lasted for an hour.  States she did not have chest pain or shortness of breath with it.  Denies fever, chills, dizziness, headache, chest pain, palpitations, shortness of breath, abdominal pain, N/V/D, urinary symptoms, LE edema.   States she received her Covid booster 2 days ago and her left arm is red, hot and tender where she had the injection.   Reviewed allergies, medications, past medical, surgical, family, and social history.    Review of Systems Pertinent positives and negatives in the history of present illness.      Objective:   Physical Exam BP 130/80   Pulse (!) 57   Wt 268 lb 6.4 oz (121.7 kg)   BMI 40.81 kg/m   Alert and in no distress.  Cardiac exam shows a regular sinus rhythm without murmurs or gallops. Lungs are clear to auscultation.  Extremities no edema.  Left upper arm with an erythematous and firm area that is tender to palpation.  No sign of infection.  Skin is warm and dry.       Assessment & Plan:  Hypertension associated with diabetes ([place]) - Plan: CBC with Differential/Platelet, Comprehensive metabolic panel -Blood pressure is at goal range today.  She admittedly misses some of her doses of blood pressure medication.  Encourage good compliance.  Encourage low-sodium diet.  Check labs.  Continue current medication regimen.  Follow-up in 3 months.  Mixed hyperlipidemia due to type 2 diabetes mellitus ([place]) - Plan: Lipid panel -Continue statin therapy.  Recommend better compliance . check fasting lipids and follow-up.  Recommend low-cholesterol, low-fat diet.  Uncontrolled type 2 diabetes mellitus with hyperglycemia ([place]) - Plan: CBC with Differential/Platelet, Comprehensive metabolic panel, POCT Urinalysis DIP (Proadvantage Device), TSH, T4, free, Hemoglobin A1c, Microalbumin / creatinine urine ratio, Ambulatory referral to Endocrinology -Urinalysis dipstick shows 3+ glucose in her urine.  Discussed health consequences associated with uncontrolled diabetes including kidney dysfunction leading  to dialysis, heart disease and stroke. She has not been taking her Metformin as prescribed.  I will send in a new prescription for her to take 1000 mg twice daily.  I will also increase her Toujeo from 14 to 16 units.  Once again I will refer her to endocrinology as her diabetes has reportedly never been under good control.  Urine microalbumin ordered today.  Morbid obesity ([place]) - Plan: Comprehensive metabolic panel, TSH, T4, free, Lipid panel -Discussed that this is a risk factor for heart  disease and other chronic health conditions  Smoker -Smoking 1/2 pack/day and is not ready to stop.  Counseling on long-term health consequences associated with smoking.  Encouraged her to stop.  Vitamin D deficiency - Plan: VITAMIN D 25 Hydroxy (Vit-D Deficiency, Fractures) -She did not follow-up after completing the high-dose vitamin D prescription.  She is not currently taking a vitamin D supplement.  Check vitamin D level and follow-up  Intermittent palpitations - Plan: TSH, T4, free -Episode of fast heart beat 2 to 3 months ago without chest pain or dyspnea.  Discussed risk factors for heart disease including diabetes, hypertension, hyperlipidemia, obesity, smoking.  Discussed potential referral to cardiology for further work-up but she would like to hold off for now.   Local reaction to COVID-19 vaccine -She is having a localized reaction to her Covid booster but no sign of infection.  She may use cool compresses and her arm should gradually improve over the next few days.  She will follow-up if it is worsening.

## 2020-02-26 LAB — CBC WITH DIFFERENTIAL/PLATELET
Basophils Absolute: 0.1 10*3/uL (ref 0.0–0.2)
Basos: 1 %
EOS (ABSOLUTE): 0.3 10*3/uL (ref 0.0–0.4)
Eos: 4 %
Hematocrit: 39 % (ref 34.0–46.6)
Hemoglobin: 12.7 g/dL (ref 11.1–15.9)
Immature Grans (Abs): 0 10*3/uL (ref 0.0–0.1)
Immature Granulocytes: 0 %
Lymphocytes Absolute: 2.6 10*3/uL (ref 0.7–3.1)
Lymphs: 36 %
MCH: 27.6 pg (ref 26.6–33.0)
MCHC: 32.6 g/dL (ref 31.5–35.7)
MCV: 85 fL (ref 79–97)
Monocytes Absolute: 0.7 10*3/uL (ref 0.1–0.9)
Monocytes: 9 %
Neutrophils Absolute: 3.6 10*3/uL (ref 1.4–7.0)
Neutrophils: 50 %
Platelets: 213 10*3/uL (ref 150–450)
RBC: 4.6 x10E6/uL (ref 3.77–5.28)
RDW: 13.4 % (ref 11.7–15.4)
WBC: 7.3 10*3/uL (ref 3.4–10.8)

## 2020-02-26 LAB — TSH: TSH: 0.821 u[IU]/mL (ref 0.450–4.500)

## 2020-02-26 LAB — LIPID PANEL
Chol/HDL Ratio: 2.9 ratio (ref 0.0–4.4)
Cholesterol, Total: 120 mg/dL (ref 100–199)
HDL: 42 mg/dL (ref >39)
LDL Chol Calc (NIH): 66 mg/dL (ref 0–99)
Triglycerides: 54 mg/dL (ref 0–149)
VLDL Cholesterol Cal: 12 mg/dL (ref 5–40)

## 2020-02-26 LAB — T4, FREE: Free T4: 1.34 ng/dL (ref 0.82–1.77)

## 2020-02-26 LAB — MICROALBUMIN / CREATININE URINE RATIO
Creatinine, Urine: 142.2 mg/dL
Microalb/Creat Ratio: 8 mg/g creat (ref 0–29)
Microalbumin, Urine: 10.9 ug/mL

## 2020-02-26 LAB — COMPREHENSIVE METABOLIC PANEL
ALT: 11 IU/L (ref 0–32)
AST: 9 IU/L (ref 0–40)
Albumin/Globulin Ratio: 1.8 (ref 1.2–2.2)
Albumin: 4.2 g/dL (ref 3.8–4.9)
Alkaline Phosphatase: 94 IU/L (ref 44–121)
BUN/Creatinine Ratio: 15 (ref 9–23)
BUN: 13 mg/dL (ref 6–24)
Bilirubin Total: 0.3 mg/dL (ref 0.0–1.2)
CO2: 26 mmol/L (ref 20–29)
Calcium: 9.5 mg/dL (ref 8.7–10.2)
Chloride: 101 mmol/L (ref 96–106)
Creatinine, Ser: 0.87 mg/dL (ref 0.57–1.00)
GFR calc Af Amer: 84 mL/min/{1.73_m2} (ref >59)
GFR calc non Af Amer: 73 mL/min/{1.73_m2} (ref >59)
Globulin, Total: 2.4 g/dL (ref 1.5–4.5)
Glucose: 240 mg/dL — ABNORMAL HIGH (ref 65–99)
Potassium: 3.8 mmol/L (ref 3.5–5.2)
Sodium: 139 mmol/L (ref 134–144)
Total Protein: 6.6 g/dL (ref 6.0–8.5)

## 2020-02-26 LAB — HEMOGLOBIN A1C
Est. average glucose Bld gHb Est-mCnc: 263 mg/dL
Hgb A1c MFr Bld: 10.8 % — ABNORMAL HIGH (ref 4.8–5.6)

## 2020-02-26 LAB — VITAMIN D 25 HYDROXY (VIT D DEFICIENCY, FRACTURES): Vit D, 25-Hydroxy: 21.7 ng/mL — ABNORMAL LOW (ref 30.0–100.0)

## 2020-02-27 ENCOUNTER — Other Ambulatory Visit: Payer: Self-pay | Admitting: Family Medicine

## 2020-02-27 DIAGNOSIS — E559 Vitamin D deficiency, unspecified: Secondary | ICD-10-CM

## 2020-02-27 MED ORDER — VITAMIN D (ERGOCALCIFEROL) 1.25 MG (50000 UNIT) PO CAPS: 50000.0000 [IU] | ORAL_CAPSULE | ORAL | 0 refills | Status: DC

## 2020-02-27 NOTE — Progress Notes (Signed)
Her Hgb A1c is 10.8 and this is significantly elevated. I recommend she schedule with endocrinology as referred and take her diabetes medications as prescribed. I also recommend she start checking her blood sugars twice daily, fasting and 2 hours after lunch or dinner. Take her readings to her endocrinology visit.  Her cholesterol looks good so continue taking atorvastatin as prescribed.  Her vitamin D is low. I will send in a prescription for her to take once weekly and then when she has finished those, she should take 1,000 IUs of vitamin D3 over the counter daily.

## 2020-04-20 ENCOUNTER — Telehealth: Payer: Self-pay | Admitting: Internal Medicine

## 2020-04-20 NOTE — Telephone Encounter (Signed)
We got a request from [facility] for Dexcom. Pt was referred to [facility] in December 2021 and I do not see she has scheduled yet so she needs to contact them to schedule an appointment and ask them about the dexcom.

## 2020-05-02 ENCOUNTER — Telehealth: Payer: Self-pay

## 2020-05-02 NOTE — Telephone Encounter (Signed)
[facility] called [phone] and they want to send pt free Dexcom CGM sample, is this ok?

## 2020-05-02 NOTE — Telephone Encounter (Signed)
She will be seeing an endocrinologist for her diabetes and is scheduled in March. I recommend that they contact Dr. Ricky Stabs office.

## 2020-05-02 NOTE — Telephone Encounter (Signed)
Faxed back to [facility] with below info

## 2020-05-11 NOTE — Progress Notes (Signed)
Name: Jane Lopez  MRN/ DOB: [id number], [birth year]   Age/ Sex: 60 y.o., female    PCP: Girtha Rm, NP-C   Reason for Endocrinology Evaluation: Type 2 Diabetes Mellitus     Date of Initial Endocrinology Visit: 05/12/2020     PATIENT IDENTIFIER: Jane Lopez is a 60 y.o. female with a past medical history of T2DM, Dyslipidemia and HTN. The patient presented for initial endocrinology clinic visit on 05/12/2020 for consultative assistance with her diabetes management.    HPI: Ms. Bacote was    Diagnosed with DM  In 2020 Prior Medications tried/Intolerance: Wilder Glade - made her sick ( abdominal pain) Currently checking blood sugars occasionally   Hypoglycemia episodes : no              Hemoglobin A1c has ranged from 9.3% in 2021, peaking at 11.8% in 2020. Patient required assistance for hypoglycemia: no Patient has required hospitalization within the last 1 year from hyper or hypoglycemia: no   In terms of diet, the patient eats 2 meal a day , drinks water and sweet tea    HOME DIABETES REGIMEN: Metformin 1000 mg BID  Toujeo 16 units daily    Statin: yes ACE-I/ARB: yes Prior Diabetic Education: no    METER DOWNLOAD SUMMARY: Did not  Bring    DIABETIC COMPLICATIONS: Microvascular complications:    Denies: CKD, retinopathy, neuropathy   Last eye exam: Completed 2021  Macrovascular complications:    Denies: CAD, PVD, CVA   PAST HISTORY: Past Medical History:  Past Medical History:  Diagnosis Date  . Allergy    seasonal allergies  . Diabetes ([place])   . Elevated LDL cholesterol level   . Hyperlipidemia    on meds  . Hypertension    on meds   Past Surgical History:  Past Surgical History:  Procedure Laterality Date  . ABDOMINAL HYSTERECTOMY    . APPENDECTOMY        Social History:  reports that she has been smoking cigarettes. She has a 30.00 pack-year smoking history. She has never used smokeless tobacco. She reports previous alcohol  use. She reports that she does not use drugs. Family History:  Family History  Problem Relation Age of Onset  . Hypertension Mother   . Diabetes Father   . Renal Disease Father   . Diabetes Sister   . HIV Sister   . Heart attack Maternal Grandmother   . Colon polyps Neg Hx   . Colon cancer Neg Hx   . Esophageal cancer Neg Hx   . Rectal cancer Neg Hx   . Stomach cancer Neg Hx      HOME MEDICATIONS: Allergies as of 05/12/2020      Reactions   Penicillins Rash      Medication List       Accurate as of May 12, 2020  2:07 PM. If you have any questions, ask your nurse or doctor.        amLODipine 10 MG tablet Commonly known as: NORVASC Take 1 tablet (10 mg total) by mouth daily. What changed:   medication strength  how much to take Changed by: Dorita Sciara, MD   atorvastatin 20 MG tablet Commonly known as: LIPITOR Take 1 tablet (20 mg total) by mouth daily.   Insulin Pen Needle 32G X 4 MM Misc Use with insulin medication   losartan-hydrochlorothiazide 100-25 MG tablet Commonly known as: HYZAAR Take 1 tablet by mouth daily.   metFORMIN 1000 MG tablet Commonly  known as: GLUCOPHAGE Take 1 tablet (1,000 mg total) by mouth 2 (two) times daily with a meal.   Toujeo SoloStar 300 UNIT/ML Solostar Pen Generic drug: insulin glargine (1 Unit Dial) Inject 24 Units into the skin daily. What changed: how much to take Changed by: Dorita Sciara, MD   Trulicity 1.09 NA/3.5TD Sopn Generic drug: Dulaglutide Inject 0.75 mg into the skin once a week. Started by: Dorita Sciara, MD   Vitamin D (Ergocalciferol) 1.25 MG (50000 UNIT) Caps capsule Commonly known as: DRISDOL Take 1 capsule (50,000 Units total) by mouth every 7 (seven) days.        ALLERGIES: Allergies  Allergen Reactions  . Penicillins Rash     REVIEW OF SYSTEMS: A comprehensive ROS was conducted with the patient and is negative except as per HPI and below:  ROS    OBJECTIVE:    VITAL SIGNS: BP (!) 158/90   Pulse (!) 58   Ht 5\' 8"  (1.727 m)   Wt 270 lb 5 oz (122.6 kg)   SpO2 98%   BMI 41.10 kg/m    PHYSICAL EXAM:  General: Pt appears well and is in NAD  Hydration: Well-hydrated with moist mucous membranes and good skin turgor  HEENT: Head: Unremarkable with good dentition. Oropharynx clear without exudate.  Eyes: External eye exam normal without stare, lid lag or exophthalmos.  EOM intact.  PERRL.  Neck: General: Supple without adenopathy or carotid bruits. Thyroid: Thyroid size normal.  No goiter or nodules appreciated. No thyroid bruit.  Lungs: Clear with good BS bilat with no rales, rhonchi, or wheezes  Heart: RRR with normal S1 and S2 and no gallops; no murmurs; no rub  Abdomen: Normoactive bowel sounds, soft, nontender, without masses or organomegaly palpable  Extremities:  Lower extremities - No pretibial edema. No lesions.  Skin: Normal texture and temperature to palpation. No rash noted. No Acanthosis nigricans/skin tags. No lipohypertrophy.  Neuro: MS is good with appropriate affect, pt is alert and Ox3    DM foot exam: 05/12/2020  The skin of the feet is without sores or ulcerations, right heel callous formation  The pedal pulses are 2+ on right and 2+ on left. The sensation is intact to a screening 5.07, 10 gram monofilament bilaterally   DATA REVIEWED:  Lab Results  Component Value Date   HGBA1C 9.0 (A) 05/12/2020   HGBA1C 10.8 (H) 02/25/2020   HGBA1C 9.3 (A) 08/13/2019   Lab Results  Component Value Date   LDLCALC 66 02/25/2020   CREATININE 0.87 02/25/2020   Lab Results  Component Value Date   MICRALBCREAT 8 02/25/2020    Lab Results  Component Value Date   CHOL 120 02/25/2020   HDL 42 02/25/2020   LDLCALC 66 02/25/2020   TRIG 54 02/25/2020   CHOLHDL 2.9 02/25/2020        ASSESSMENT / PLAN / RECOMMENDATIONS:   1) Type 2 Diabetes Mellitus, Poorly controlled, Without complications - Most recent A1c of 9.0 %. Goal A1c <  7.0 %.    Plan: GENERAL: I have discussed with the patient the pathophysiology of diabetes. We went over the natural progression of the disease. We talked about both insulin resistance and insulin deficiency. We stressed the importance of lifestyle changes including diet and exercise. I explained the complications associated with diabetes including retinopathy, nephropathy, neuropathy as well as increased risk of cardiovascular disease. We went over the benefit seen with glycemic control.    I explained to the patient that  diabetic patients are at higher than normal risk for amputations.   We discussed importance of avoiding sugar sweetened beverages   Will increase insulin as below   Will add Trulicity, cautioned against Gi side effects   MEDICATIONS: - Start Trulicity 0.35 mg ONCE weekly  - Continue Metformin 1000 mg Twice daily  - Increase toujeo to 24 units daily     EDUCATION / INSTRUCTIONS:  BG monitoring instructions: Patient is instructed to check her blood sugars 1 times a day, fasting   Call [facility] if: BG persistently < 70  . I reviewed the Rule of 15 for the treatment of hypoglycemia in detail with the patient. Literature supplied.   2) Diabetic complications:   Eye: Does not have known diabetic retinopathy.   Neuro/ Feet: Does not have known diabetic peripheral neuropathy.  Renal: Patient does not have known baseline CKD. She is on an ACEI/ARB at present.  3) Dyslipidemia:   - LDL as high as 115 mg/dL. With improvement to 66 mg /dL by 02/2020 - Discussed cardiovascular benefits   Continue  Atorvastatin 20 mg daily    4) HTN :   - BP above goal , will increase Amlodipine as below  - Pt to follow up with PCP for further adjustments   Medication Increase Amlodipine to 10 mg daily  Continue Losartan- HCTZ 100-25 mg daily   F/U in 3 months    Signed electronically by: Mack Guise, MD  [facility]  [facility] [address] [place],  [address] Phone: [phone] FAX: [phone]   CC: Girtha Rm, NP-C [address] [place] [address] Phone: [phone]  Fax: [phone]    Return to Endocrinology clinic as below: Future Appointments  Date Time Provider [place]  05/26/2020  8:30 AM Girtha Rm, NP-C [facility] [place]  08/11/2020  8:30 AM Toula Miyasaki, Melanie Crazier, MD [facility] None

## 2020-05-12 ENCOUNTER — Ambulatory Visit: Payer: Self-pay | Admitting: Internal Medicine

## 2020-05-12 ENCOUNTER — Ambulatory Visit (INDEPENDENT_AMBULATORY_CARE_PROVIDER_SITE_OTHER): Payer: BC Managed Care – PPO | Admitting: Internal Medicine

## 2020-05-12 ENCOUNTER — Encounter: Payer: Self-pay | Admitting: Internal Medicine

## 2020-05-12 ENCOUNTER — Other Ambulatory Visit: Payer: Self-pay

## 2020-05-12 VITALS — BP 158/90 | HR 58 | Ht 68.0 in | Wt 270.3 lb

## 2020-05-12 DIAGNOSIS — E785 Hyperlipidemia, unspecified: Secondary | ICD-10-CM

## 2020-05-12 DIAGNOSIS — I10 Essential (primary) hypertension: Secondary | ICD-10-CM | POA: Diagnosis not present

## 2020-05-12 DIAGNOSIS — E1165 Type 2 diabetes mellitus with hyperglycemia: Secondary | ICD-10-CM | POA: Diagnosis not present

## 2020-05-12 HISTORY — DX: Hyperlipidemia, unspecified: E78.5

## 2020-05-12 LAB — POCT GLYCOSYLATED HEMOGLOBIN (HGB A1C): Hemoglobin A1C: 9 % — AB (ref 4.0–5.6)

## 2020-05-12 LAB — POCT GLUCOSE (DEVICE FOR HOME USE): POC Glucose: 267 mg/dl — AB (ref 70–99)

## 2020-05-12 MED ORDER — AMLODIPINE BESYLATE 10 MG PO TABS
10.0000 mg | ORAL_TABLET | Freq: Every day | ORAL | 3 refills | Status: DC
Start: 2020-05-12 — End: 2020-12-07

## 2020-05-12 MED ORDER — METFORMIN HCL 1000 MG PO TABS: 1000.0000 mg | ORAL_TABLET | Freq: Two times a day (BID) | ORAL | 3 refills | Status: DC

## 2020-05-12 MED ORDER — TOUJEO SOLOSTAR 300 UNIT/ML ~~LOC~~ SOPN: 24.0000 [IU] | PEN_INJECTOR | Freq: Every day | SUBCUTANEOUS | 6 refills | Status: DC

## 2020-05-12 MED ORDER — TRULICITY 0.75 MG/0.5ML ~~LOC~~ SOAJ
0.7500 mg | SUBCUTANEOUS | 6 refills | Status: DC
Start: 2020-05-12 — End: 2020-12-07

## 2020-05-12 NOTE — Patient Instructions (Signed)
-   Start Trulicity 7.58 mg ONCE weekly  - Continue Metformin 1000 mg Twice daily  - Increase toujeo to 24 units daily      HOW TO TREAT LOW BLOOD SUGARS (Blood sugar LESS THAN 70 MG/DL)  Please follow the RULE OF 15 for the treatment of hypoglycemia treatment (when your (blood sugars are less than 70 mg/dL)    STEP 1: Take 15 grams of carbohydrates when your blood sugar is low, which includes:   3-4 GLUCOSE TABS  OR  3-4 OZ OF JUICE OR REGULAR SODA OR  ONE TUBE OF GLUCOSE GEL     STEP 2: RECHECK blood sugar in 15 MINUTES STEP 3: If your blood sugar is still low at the 15 minute recheck --> then, go back to STEP 1 and treat AGAIN with another 15 grams of carbohydrates.

## 2020-05-26 ENCOUNTER — Ambulatory Visit: Payer: BC Managed Care – PPO | Admitting: Family Medicine

## 2020-05-30 ENCOUNTER — Encounter: Payer: Self-pay | Admitting: Family Medicine

## 2020-06-16 ENCOUNTER — Ambulatory Visit (INDEPENDENT_AMBULATORY_CARE_PROVIDER_SITE_OTHER): Payer: BC Managed Care – PPO | Admitting: Family Medicine

## 2020-06-16 ENCOUNTER — Encounter: Payer: Self-pay | Admitting: Family Medicine

## 2020-06-16 ENCOUNTER — Other Ambulatory Visit: Payer: Self-pay

## 2020-06-16 VITALS — BP 130/80 | HR 75 | Wt 272.4 lb

## 2020-06-16 DIAGNOSIS — F17200 Nicotine dependence, unspecified, uncomplicated: Secondary | ICD-10-CM

## 2020-06-16 DIAGNOSIS — I10 Essential (primary) hypertension: Secondary | ICD-10-CM

## 2020-06-16 DIAGNOSIS — E782 Mixed hyperlipidemia: Secondary | ICD-10-CM

## 2020-06-16 DIAGNOSIS — E1169 Type 2 diabetes mellitus with other specified complication: Secondary | ICD-10-CM

## 2020-06-16 DIAGNOSIS — E559 Vitamin D deficiency, unspecified: Secondary | ICD-10-CM

## 2020-06-16 MED ORDER — ATORVASTATIN CALCIUM 20 MG PO TABS: 20.0000 mg | ORAL_TABLET | Freq: Every day | ORAL | 1 refills | Status: DC

## 2020-06-16 NOTE — Progress Notes (Signed)
   Subjective:    Patient ID: Jane Lopez, female    DOB: January 26, 1961, 60 y.o.   MRN: [id number]  HPI Chief Complaint  Patient presents with  . follow-up    Follow-up on BP but has not been check BP outside of doctor office   She is here for 77-month follow-up on hypertension and other chronic health conditions. She is now seeing an endocrinologist for her diabetes and is pleased with this.  She does not check blood pressure at home.  Reports taking amlodipine 10 mg and losartan/HCTZ daily without any issues. States she is eating a healthy diet including low sodium.  Requests refill of her Lipitor.  Reports taking it daily.  No side effects.  States she is still smoking and does not plan on stopping anytime soon.  Vitamin D deficiency-states she completed the prescription vitamin D and is now taking a multivitamin.  Denies fever, chills, dizziness, chest pain, palpitations, shortness of breath, abdominal pain, N/V/D, urinary symptoms, LE edema.   Reviewed allergies, medications, past medical, surgical, family, and social history.   Review of Systems Pertinent positives and negatives in the history of present illness.     Objective:   Physical Exam BP 130/80   Pulse 75   Wt 272 lb 6.4 oz (123.6 kg)   BMI 41.42 kg/m   Alert and oriented in no acute distress.  Respirations unlabored.  Normal speech, mood and memory.      Assessment & Plan:  Primary hypertension  Morbid obesity (HCC)  Smoker  Mixed hyperlipidemia due to type 2 diabetes mellitus ([place]) - Plan: atorvastatin (LIPITOR) 20 MG tablet  Vitamin D deficiency  She is here for 42-month follow-up.  She is in good spirits and reportedly feels "great". Blood pressure controlled.  Continue on current medication regimen.  Continue low-sodium diet.  Try to increase physical activity. Encouraged her to stop smoking but she does not have a plan to do this. Continue taking multivitamin with vitamin D3 1000  IUs. Reviewed labs including cholesterol, LDL in goal.  Continue atorvastatin. Upcoming appointments with dietitian and endocrinology. Follow-up in August for a fasting CPE.

## 2020-07-14 ENCOUNTER — Ambulatory Visit: Payer: BC Managed Care – PPO | Admitting: Dietician

## 2020-08-11 ENCOUNTER — Ambulatory Visit: Payer: BC Managed Care – PPO | Admitting: Internal Medicine

## 2020-10-20 ENCOUNTER — Encounter: Payer: BC Managed Care – PPO | Admitting: Family Medicine

## 2020-11-05 ENCOUNTER — Other Ambulatory Visit: Payer: Self-pay | Admitting: Family Medicine

## 2020-11-05 DIAGNOSIS — E1159 Type 2 diabetes mellitus with other circulatory complications: Secondary | ICD-10-CM

## 2020-11-05 DIAGNOSIS — I10 Essential (primary) hypertension: Secondary | ICD-10-CM

## 2020-12-07 ENCOUNTER — Other Ambulatory Visit: Payer: Self-pay

## 2020-12-07 ENCOUNTER — Other Ambulatory Visit: Payer: Self-pay | Admitting: *Deleted

## 2020-12-07 DIAGNOSIS — E1159 Type 2 diabetes mellitus with other circulatory complications: Secondary | ICD-10-CM

## 2020-12-07 DIAGNOSIS — E1165 Type 2 diabetes mellitus with hyperglycemia: Secondary | ICD-10-CM

## 2020-12-07 DIAGNOSIS — E1169 Type 2 diabetes mellitus with other specified complication: Secondary | ICD-10-CM

## 2020-12-07 DIAGNOSIS — I152 Hypertension secondary to endocrine disorders: Secondary | ICD-10-CM

## 2020-12-07 DIAGNOSIS — E782 Mixed hyperlipidemia: Secondary | ICD-10-CM

## 2020-12-07 DIAGNOSIS — I10 Essential (primary) hypertension: Secondary | ICD-10-CM

## 2020-12-07 MED ORDER — TRULICITY 0.75 MG/0.5ML ~~LOC~~ SOAJ: 0.7500 mg | SUBCUTANEOUS | 0 refills | Status: DC

## 2020-12-07 MED ORDER — METFORMIN HCL 1000 MG PO TABS: 1000.0000 mg | ORAL_TABLET | Freq: Two times a day (BID) | ORAL | 0 refills | Status: DC

## 2020-12-07 MED ORDER — LOSARTAN POTASSIUM-HCTZ 100-25 MG PO TABS
1.0000 | ORAL_TABLET | Freq: Every day | ORAL | 0 refills | Status: DC
Start: 2020-12-07 — End: 2021-03-15

## 2020-12-07 MED ORDER — TOUJEO SOLOSTAR 300 UNIT/ML ~~LOC~~ SOPN
24.0000 [IU] | PEN_INJECTOR | Freq: Every day | SUBCUTANEOUS | 1 refills | Status: DC
Start: 2020-12-07 — End: 2021-08-03

## 2020-12-07 MED ORDER — ATORVASTATIN CALCIUM 20 MG PO TABS: 20.0000 mg | ORAL_TABLET | Freq: Every day | ORAL | 0 refills | Status: DC

## 2020-12-07 MED ORDER — AMLODIPINE BESYLATE 10 MG PO TABS: 10.0000 mg | ORAL_TABLET | Freq: Every day | ORAL | 0 refills | Status: DC

## 2020-12-07 NOTE — Telephone Encounter (Signed)
Medication sent.

## 2021-02-12 ENCOUNTER — Other Ambulatory Visit: Payer: Self-pay | Admitting: Internal Medicine

## 2021-02-23 ENCOUNTER — Encounter: Payer: BC Managed Care – PPO | Admitting: Family Medicine

## 2021-03-15 ENCOUNTER — Other Ambulatory Visit: Payer: Self-pay

## 2021-03-15 DIAGNOSIS — I10 Essential (primary) hypertension: Secondary | ICD-10-CM

## 2021-03-15 DIAGNOSIS — E1169 Type 2 diabetes mellitus with other specified complication: Secondary | ICD-10-CM

## 2021-03-15 DIAGNOSIS — I152 Hypertension secondary to endocrine disorders: Secondary | ICD-10-CM

## 2021-03-15 DIAGNOSIS — E1159 Type 2 diabetes mellitus with other circulatory complications: Secondary | ICD-10-CM

## 2021-03-15 MED ORDER — ATORVASTATIN CALCIUM 20 MG PO TABS: 20.0000 mg | ORAL_TABLET | Freq: Every day | ORAL | 0 refills | Status: DC

## 2021-03-15 MED ORDER — LOSARTAN POTASSIUM-HCTZ 100-25 MG PO TABS: 1.0000 | ORAL_TABLET | Freq: Every day | ORAL | 0 refills | Status: DC

## 2021-03-15 MED ORDER — AMLODIPINE BESYLATE 10 MG PO TABS: 10.0000 mg | ORAL_TABLET | Freq: Every day | ORAL | 0 refills | Status: DC

## 2021-03-21 ENCOUNTER — Other Ambulatory Visit: Payer: Self-pay

## 2021-03-21 MED ORDER — METFORMIN HCL 1000 MG PO TABS: 1000.0000 mg | ORAL_TABLET | Freq: Two times a day (BID) | ORAL | 0 refills | Status: DC

## 2021-04-27 ENCOUNTER — Encounter: Payer: Self-pay | Admitting: Physician Assistant

## 2021-04-27 ENCOUNTER — Ambulatory Visit (INDEPENDENT_AMBULATORY_CARE_PROVIDER_SITE_OTHER): Payer: BC Managed Care – PPO | Admitting: Physician Assistant

## 2021-04-27 ENCOUNTER — Other Ambulatory Visit: Payer: Self-pay

## 2021-04-27 VITALS — BP 164/92 | HR 73 | Temp 98.0°F | Ht 67.5 in | Wt 264.2 lb

## 2021-04-27 DIAGNOSIS — Z23 Encounter for immunization: Secondary | ICD-10-CM | POA: Diagnosis not present

## 2021-04-27 DIAGNOSIS — I152 Hypertension secondary to endocrine disorders: Secondary | ICD-10-CM

## 2021-04-27 DIAGNOSIS — I10 Essential (primary) hypertension: Secondary | ICD-10-CM

## 2021-04-27 DIAGNOSIS — E1165 Type 2 diabetes mellitus with hyperglycemia: Secondary | ICD-10-CM

## 2021-04-27 DIAGNOSIS — E1169 Type 2 diabetes mellitus with other specified complication: Secondary | ICD-10-CM | POA: Diagnosis not present

## 2021-04-27 DIAGNOSIS — Z0000 Encounter for general adult medical examination without abnormal findings: Secondary | ICD-10-CM

## 2021-04-27 DIAGNOSIS — E1159 Type 2 diabetes mellitus with other circulatory complications: Secondary | ICD-10-CM | POA: Diagnosis not present

## 2021-04-27 DIAGNOSIS — Z125 Encounter for screening for malignant neoplasm of prostate: Secondary | ICD-10-CM

## 2021-04-27 DIAGNOSIS — E782 Mixed hyperlipidemia: Secondary | ICD-10-CM

## 2021-04-27 DIAGNOSIS — Z1231 Encounter for screening mammogram for malignant neoplasm of breast: Secondary | ICD-10-CM

## 2021-04-27 DIAGNOSIS — J3089 Other allergic rhinitis: Secondary | ICD-10-CM

## 2021-04-27 DIAGNOSIS — H9193 Unspecified hearing loss, bilateral: Secondary | ICD-10-CM

## 2021-04-27 LAB — POCT GLYCOSYLATED HEMOGLOBIN (HGB A1C): Hemoglobin A1C: 7 % — AB (ref 4.0–5.6)

## 2021-04-27 LAB — LIPID PANEL

## 2021-04-27 MED ORDER — AMLODIPINE BESYLATE 10 MG PO TABS: 10.0000 mg | ORAL_TABLET | Freq: Every day | ORAL | 2 refills | Status: DC

## 2021-04-27 MED ORDER — ATORVASTATIN CALCIUM 20 MG PO TABS: 20.0000 mg | ORAL_TABLET | Freq: Every day | ORAL | 2 refills | Status: DC

## 2021-04-27 MED ORDER — LOSARTAN POTASSIUM-HCTZ 100-25 MG PO TABS: 1.0000 | ORAL_TABLET | Freq: Every day | ORAL | 2 refills | Status: DC

## 2021-04-27 NOTE — Progress Notes (Signed)
Established Patient Office Visit  Subjective:  Patient ID: Jane Lopez, female    DOB: [birth year]  Age: 61 y.o. MRN: [id number]  CC:  Chief Complaint  Patient presents with   Annual Exam    Fasting     HPI CHRISS REDEL presents for an annual exam.  Reports hearing a "static" sound in both ears x 1 month; denies that it is a ringing; state the sound seems louder when she is at home or when she tries to lie down to go to sleep at night; denies head congestion; + hx/o allergic rhinitis, but denies head congestion today.  Past Medical History:  Diagnosis Date   Allergy    seasonal allergies   Diabetes ([place])    Dyslipidemia 05/12/2020   Elevated LDL cholesterol level    Hyperlipidemia    on meds   Hypertension    on meds    Past Surgical History:  Procedure Laterality Date   ABDOMINAL HYSTERECTOMY     APPENDECTOMY      Family History  Problem Relation Age of Onset   Hypertension Mother    Diabetes Father    Renal Disease Father    Diabetes Sister    HIV Sister    Heart attack Maternal Grandmother    Colon polyps Neg Hx    Colon cancer Neg Hx    Esophageal cancer Neg Hx    Rectal cancer Neg Hx    Stomach cancer Neg Hx     Social History   Socioeconomic History   Marital status: Divorced    Spouse name: Not on file   Number of children: Not on file   Years of education: Not on file   Highest education level: Not on file  Occupational History   Not on file  Tobacco Use   Smoking status: Every Day    Packs/day: 1.00    Years: 30.00    Pack years: 30.00    Types: Cigarettes   Smokeless tobacco: Never  Vaping Use   Vaping Use: Never used  Substance and Sexual Activity   Alcohol use: Not Currently    Comment: occassionally   Drug use: Never   Sexual activity: Not Currently  Other Topics Concern   Not on file  Social History Narrative   Not on file   Social Determinants of Health   Financial Resource Strain: Not on file  Food  Insecurity: Not on file  Transportation Needs: Not on file  Physical Activity: Not on file  Stress: Not on file  Social Connections: Not on file  Intimate Partner Violence: Not on file    Outpatient Medications Prior to Visit  Medication Sig Dispense Refill   insulin glargine, 1 Unit Dial, (TOUJEO SOLOSTAR) 300 UNIT/ML Solostar Pen Inject 24 Units into the skin daily. 15 mL 1   Insulin Pen Needle 32G X 4 MM MISC Use with insulin medication 100 each 1   metFORMIN (GLUCOPHAGE) 1000 MG tablet Take 1 tablet (1,000 mg total) by mouth 2 (two) times daily with a meal. 419 tablet 0   TRULICITY 3.79 KW/4.0XB SOPN INJECT 0.75 MG UNDER THE SKIN ONCE A WEEK 6 mL 0   amLODipine (NORVASC) 10 MG tablet Take 1 tablet (10 mg total) by mouth daily. 90 tablet 0   atorvastatin (LIPITOR) 20 MG tablet Take 1 tablet (20 mg total) by mouth daily. 90 tablet 0   losartan-hydrochlorothiazide (HYZAAR) 100-25 MG tablet Take 1 tablet by mouth daily. 90 tablet 0  Facility-Administered Medications Prior to Visit  Medication Dose Route Frequency Provider Last Rate Last Admin   0.9 %  sodium chloride infusion  500 mL Intravenous Once [place], MD        Allergies  Allergen Reactions   Penicillins Rash    ROS Review of Systems  Constitutional:  Negative for activity change and fever.  HENT:  Positive for tinnitus. Negative for congestion, ear pain and voice change.   Eyes:  Negative for redness.  Respiratory:  Negative for cough.   Cardiovascular:  Negative for chest pain.  Gastrointestinal:  Negative for constipation and diarrhea.  Endocrine: Negative for polyuria.  Genitourinary:  Negative for flank pain.  Musculoskeletal:  Negative for gait problem and neck stiffness.  Skin:  Negative for color change and rash.  Neurological:  Negative for dizziness.  Hematological:  Negative for adenopathy.  Psychiatric/Behavioral:  Negative for agitation, behavioral problems and confusion.      Objective:     Physical Exam Vitals and nursing note reviewed.  Constitutional:      General: She is not in acute distress.    Appearance: Normal appearance.  HENT:     Head: Normocephalic and atraumatic.     Right Ear: Tympanic membrane, ear canal and external ear normal.     Left Ear: Tympanic membrane, ear canal and external ear normal.  Eyes:     Conjunctiva/sclera: Conjunctivae normal.     Pupils: Pupils are equal, round, and reactive to light.  Neck:     Vascular: No carotid bruit.  Cardiovascular:     Rate and Rhythm: Normal rate and regular rhythm.     Pulses: Normal pulses.     Heart sounds: Normal heart sounds.  Pulmonary:     Effort: Pulmonary effort is normal. No respiratory distress.     Breath sounds: Normal breath sounds. No wheezing.  Abdominal:     General: Bowel sounds are normal.     Palpations: Abdomen is soft.  Musculoskeletal:        General: Normal range of motion.     Cervical back: Normal range of motion and neck supple.     Right lower leg: No edema.     Left lower leg: No edema.  Skin:    General: Skin is warm and dry.     Findings: No rash.  Neurological:     General: No focal deficit present.     Mental Status: She is alert and oriented to person, place, and time.     Gait: Gait normal.  Psychiatric:        Mood and Affect: Mood normal.        Behavior: Behavior normal.    BP (!) 164/92    Pulse 73    Temp 98 F (36.7 C)    Ht 5' 7.5" (1.715 m)    Wt 264 lb 3.2 oz (119.8 kg)    SpO2 97%    BMI 40.77 kg/m   Wt Readings from Last 3 Encounters:  04/27/21 264 lb 3.2 oz (119.8 kg)  06/16/20 272 lb 6.4 oz (123.6 kg)  05/12/20 270 lb 5 oz (122.6 kg)     Health Maintenance Due  Topic Date Due   HIV Screening  Never done   TETANUS/TDAP  Never done   Zoster Vaccines- Shingrix (1 of 2) Never done   OPHTHALMOLOGY EXAM  05/22/2019   COVID-19 Vaccine (4 - Booster for Moderna series) 04/19/2020   FOOT EXAM  08/12/2020   HEMOGLOBIN  A1C  11/12/2020     There are no preventive care reminders to display for this patient.  Lab Results  Component Value Date   TSH 0.821 02/25/2020   Lab Results  Component Value Date   WBC 7.3 02/25/2020   HGB 12.7 02/25/2020   HCT 39.0 02/25/2020   MCV 85 02/25/2020   PLT 213 02/25/2020   Lab Results  Component Value Date   NA 139 02/25/2020   K 3.8 02/25/2020   CO2 26 02/25/2020   GLUCOSE 240 (H) 02/25/2020   BUN 13 02/25/2020   CREATININE 0.87 02/25/2020   BILITOT 0.3 02/25/2020   ALKPHOS 94 02/25/2020   AST 9 02/25/2020   ALT 11 02/25/2020   PROT 6.6 02/25/2020   ALBUMIN 4.2 02/25/2020   CALCIUM 9.5 02/25/2020   ANIONGAP 8 10/06/2016   Lab Results  Component Value Date   CHOL 120 02/25/2020   Lab Results  Component Value Date   HDL 42 02/25/2020   Lab Results  Component Value Date   LDLCALC 66 02/25/2020   Lab Results  Component Value Date   TRIG 54 02/25/2020   Lab Results  Component Value Date   CHOLHDL 2.9 02/25/2020   Lab Results  Component Value Date   HGBA1C 9.0 (A) 05/12/2020      Assessment & Plan:   Problem List Items Addressed This Visit       Cardiovascular and Mediastinum   Hypertension associated with diabetes ([place])   Relevant Medications   amLODipine (NORVASC) 10 MG tablet   atorvastatin (LIPITOR) 20 MG tablet   losartan-hydrochlorothiazide (HYZAAR) 100-25 MG tablet   Other Relevant Orders   POCT glycosylated hemoglobin (Hb A1C)   Primary hypertension   Relevant Medications   amLODipine (NORVASC) 10 MG tablet   atorvastatin (LIPITOR) 20 MG tablet   losartan-hydrochlorothiazide (HYZAAR) 100-25 MG tablet     Endocrine   Uncontrolled type 2 diabetes mellitus with hyperglycemia ([facility])   Relevant Medications   atorvastatin (LIPITOR) 20 MG tablet   losartan-hydrochlorothiazide (HYZAAR) 100-25 MG tablet   Other Relevant Orders   POCT glycosylated hemoglobin (Hb A1C)   Mixed hyperlipidemia due to type 2 diabetes mellitus (HCC)   Relevant  Medications   amLODipine (NORVASC) 10 MG tablet   atorvastatin (LIPITOR) 20 MG tablet   losartan-hydrochlorothiazide (HYZAAR) 100-25 MG tablet   Other Relevant Orders   POCT glycosylated hemoglobin (Hb A1C)   Other Visit Diagnoses     Routine medical exam    -  Primary   Relevant Orders   HIV antibody (with reflex)   Varicella-zoster vaccine IM   CBC with Differential/Platelet   Comprehensive metabolic panel   Lipid panel   Non-seasonal allergic rhinitis, unspecified trigger       Relevant Orders   Ambulatory referral to ENT   Screening mammogram for breast cancer       Relevant Orders   MM Digital Screening   Screening for prostate cancer       Problem with, hearing, bilateral       Relevant Medications   losartan-hydrochlorothiazide (HYZAAR) 100-25 MG tablet   Other Relevant Orders   Ambulatory referral to ENT       Meds ordered this encounter  Medications   amLODipine (NORVASC) 10 MG tablet    Sig: Take 1 tablet (10 mg total) by mouth daily.    Dispense:  90 tablet    Refill:  2    Order Specific Question:   Supervising Provider  Answer:   Denita Lung [[id number]]   atorvastatin (LIPITOR) 20 MG tablet    Sig: Take 1 tablet (20 mg total) by mouth daily.    Dispense:  90 tablet    Refill:  2    Order Specific Question:   Supervising Provider    Answer:   Denita Lung [[id number]]   losartan-hydrochlorothiazide (HYZAAR) 100-25 MG tablet    Sig: Take 1 tablet by mouth daily.    Dispense:  90 tablet    Refill:  2    Order Specific Question:   Supervising Provider    Answer:   Denita Lung [[id number]]    Follow-up: Return in about 3 months (around 07/25/2021) for Return for Follow Up Exam.   Increase clear fluids; referral to ENT for evaluation and hearing test for "static" sound in ears  Irene Pap, PA-C

## 2021-04-28 LAB — CBC WITH DIFFERENTIAL/PLATELET
Basophils Absolute: 0 10*3/uL (ref 0.0–0.2)
Basos: 1 %
EOS (ABSOLUTE): 0.4 10*3/uL (ref 0.0–0.4)
Eos: 5 %
Hematocrit: 37.5 % (ref 34.0–46.6)
Hemoglobin: 12.5 g/dL (ref 11.1–15.9)
Immature Grans (Abs): 0 10*3/uL (ref 0.0–0.1)
Immature Granulocytes: 0 %
Lymphocytes Absolute: 2.5 10*3/uL (ref 0.7–3.1)
Lymphs: 32 %
MCH: 27.7 pg (ref 26.6–33.0)
MCHC: 33.3 g/dL (ref 31.5–35.7)
MCV: 83 fL (ref 79–97)
Monocytes Absolute: 0.7 10*3/uL (ref 0.1–0.9)
Monocytes: 9 %
Neutrophils Absolute: 4.1 10*3/uL (ref 1.4–7.0)
Neutrophils: 53 %
Platelets: 240 10*3/uL (ref 150–450)
RBC: 4.51 x10E6/uL (ref 3.77–5.28)
RDW: 14.1 % (ref 11.7–15.4)
WBC: 7.7 10*3/uL (ref 3.4–10.8)

## 2021-04-28 LAB — COMPREHENSIVE METABOLIC PANEL
ALT: 13 IU/L (ref 0–32)
AST: 8 IU/L (ref 0–40)
Albumin/Globulin Ratio: 1.7 (ref 1.2–2.2)
Albumin: 4.1 g/dL (ref 3.8–4.9)
Alkaline Phosphatase: 92 IU/L (ref 44–121)
BUN/Creatinine Ratio: 17 (ref 12–28)
BUN: 15 mg/dL (ref 8–27)
Bilirubin Total: 0.2 mg/dL (ref 0.0–1.2)
CO2: 26 mmol/L (ref 20–29)
Calcium: 9.4 mg/dL (ref 8.7–10.3)
Chloride: 104 mmol/L (ref 96–106)
Creatinine, Ser: 0.87 mg/dL (ref 0.57–1.00)
Globulin, Total: 2.4 g/dL (ref 1.5–4.5)
Glucose: 147 mg/dL — ABNORMAL HIGH (ref 70–99)
Potassium: 4.2 mmol/L (ref 3.5–5.2)
Sodium: 143 mmol/L (ref 134–144)
Total Protein: 6.5 g/dL (ref 6.0–8.5)
eGFR: 76 mL/min/{1.73_m2} (ref >59)

## 2021-04-28 LAB — LIPID PANEL
Chol/HDL Ratio: 3 ratio (ref 0.0–4.4)
Cholesterol, Total: 124 mg/dL (ref 100–199)
HDL: 42 mg/dL (ref >39)
LDL Chol Calc (NIH): 70 mg/dL (ref 0–99)
Triglycerides: 51 mg/dL (ref 0–149)
VLDL Cholesterol Cal: 12 mg/dL (ref 5–40)

## 2021-04-28 LAB — HIV ANTIBODY (ROUTINE TESTING W REFLEX): HIV Screen 4th Generation wRfx: NONREACTIVE

## 2021-04-30 NOTE — Progress Notes (Signed)
Please call patient to tell her lab results.   Regarding your recent blood work:  Blood counts are good, no anemia  Liver and kidneys are fine  Cholesterol is fine; continue to eat a low fat diet  Blood sugar levels are fine; continue to take your medicine, and eat a low sugar, low carbohydrate (starchy foods), diabetic diet  No HIV

## 2021-05-07 ENCOUNTER — Other Ambulatory Visit: Payer: Self-pay | Admitting: Internal Medicine

## 2021-05-14 ENCOUNTER — Other Ambulatory Visit: Payer: Self-pay

## 2021-05-14 MED ORDER — TRULICITY 0.75 MG/0.5ML ~~LOC~~ SOAJ: SUBCUTANEOUS | 0 refills | Status: DC

## 2021-07-27 ENCOUNTER — Ambulatory Visit: Payer: BC Managed Care – PPO | Admitting: Physician Assistant

## 2021-08-02 NOTE — Progress Notes (Signed)
Established Patient Office Visit  Subjective:  Patient ID: Jane Lopez, female    DOB: 02-13-1961  Age: 61 y.o. MRN: [id number]  CC:  Chief Complaint  Patient presents with   Follow-up    3 month follow up-she is having pain in both hands.    HPI Jane Lopez presents for left and right wrist pain x 2 weeks, denies a fall or injury but states the pain is worse when she uses her hands, like opening a jar; states she has to carefully hold and carry heavy items with both hands to prevent from dropping things; also reports that she had an appointment scheduled with ENT to evaluate the ringing in her ears, but they wanted a large amount of money up front before the appointment that she was not able to pay, so she cancelled the appointment.   Outpatient Medications Prior to Visit  Medication Sig Dispense Refill   Insulin Pen Needle 32G X 4 MM MISC Use with insulin medication 100 each 1   amLODipine (NORVASC) 10 MG tablet Take 1 tablet (10 mg total) by mouth daily. 90 tablet 2   atorvastatin (LIPITOR) 20 MG tablet Take 1 tablet (20 mg total) by mouth daily. 90 tablet 2   Dulaglutide (TRULICITY) 7.03 JK/0.9FG SOPN INJECT 0.75 MG UNDER THE SKIN ONCE A WEEK (MUST HAVE APPOINTMENT FOR FURTHER REFILLS) 2 mL 0   losartan-hydrochlorothiazide (HYZAAR) 100-25 MG tablet Take 1 tablet by mouth daily. 90 tablet 2   metFORMIN (GLUCOPHAGE) 1000 MG tablet Take 1 tablet (1,000 mg total) by mouth 2 (two) times daily with a meal. 180 tablet 0   insulin glargine, 1 Unit Dial, (TOUJEO SOLOSTAR) 300 UNIT/ML Solostar Pen Inject 24 Units into the skin daily. (Patient not taking: Reported on 08/03/2021) 15 mL 1   Facility-Administered Medications Prior to Visit  Medication Dose Route Frequency Provider Last Rate Last Admin   0.9 %  sodium chloride infusion  500 mL Intravenous Once [place], MD        Allergies  Allergen Reactions   Penicillins Rash    Patient Care Team: Marcellina Millin as PCP - General (Physician Assistant) [facility] (Optometry)  ROS Review of Systems  Constitutional:  Negative for activity change and chills.  HENT:  Positive for tinnitus. Negative for congestion and voice change.   Eyes:  Negative for pain and redness.  Respiratory:  Negative for cough and wheezing.   Cardiovascular:  Negative for chest pain.  Gastrointestinal:  Negative for constipation, diarrhea, nausea and vomiting.  Endocrine: Negative for polyuria.  Genitourinary:  Negative for frequency.  Musculoskeletal:  Positive for arthralgias. Negative for joint swelling and myalgias.  Skin:  Negative for color change and rash.  Allergic/Immunologic: Negative for immunocompromised state.  Neurological:  Negative for dizziness.  Psychiatric/Behavioral:  Negative for agitation.      Objective:    Physical Exam Vitals and nursing note reviewed.  Constitutional:      General: She is not in acute distress.    Appearance: She is normal weight. She is not ill-appearing.  HENT:     Head: Normocephalic and atraumatic.     Right Ear: External ear normal.     Left Ear: External ear normal.  Eyes:     Extraocular Movements: Extraocular movements intact.     Conjunctiva/sclera: Conjunctivae normal.     Pupils: Pupils are equal, round, and reactive to light.  Cardiovascular:     Rate and Rhythm: Normal rate  and regular rhythm.     Pulses: Normal pulses.  Pulmonary:     Effort: Pulmonary effort is normal.     Breath sounds: Normal breath sounds. No wheezing.  Abdominal:     General: Bowel sounds are normal.     Palpations: Abdomen is soft.  Musculoskeletal:        General: Normal range of motion.     Right wrist: Tenderness and bony tenderness present. No swelling or deformity. Normal range of motion. Normal pulse.     Left wrist: Tenderness and bony tenderness present. No swelling or deformity. Normal range of motion. Normal pulse.     Cervical back: Normal range of motion.      Comments: Bilateral wrists tender at [facility] joint  Skin:    General: Skin is warm and dry.  Neurological:     Mental Status: She is alert and oriented to person, place, and time.  Psychiatric:        Mood and Affect: Mood normal.    BP 140/80   Pulse (!) 50   Wt 266 lb 6.4 oz (120.8 kg)   SpO2 98%   BMI 41.11 kg/m   Wt Readings from Last 3 Encounters:  08/03/21 266 lb 6.4 oz (120.8 kg)  04/27/21 264 lb 3.2 oz (119.8 kg)  06/16/20 272 lb 6.4 oz (123.6 kg)    Results for orders placed or performed in visit on 04/27/21  HIV antibody (with reflex)  Result Value Ref Range   HIV Screen 4th Generation wRfx Non Reactive Non Reactive  CBC with Differential/Platelet  Result Value Ref Range   WBC 7.7 3.4 - 10.8 x10E3/uL   RBC 4.51 3.77 - 5.28 x10E6/uL   Hemoglobin 12.5 11.1 - 15.9 g/dL   Hematocrit 37.5 34.0 - 46.6 %   MCV 83 79 - 97 fL   MCH 27.7 26.6 - 33.0 pg   MCHC 33.3 31.5 - 35.7 g/dL   RDW 14.1 11.7 - 15.4 %   Platelets 240 150 - 450 x10E3/uL   Neutrophils 53 Not Estab. %   Lymphs 32 Not Estab. %   Monocytes 9 Not Estab. %   Eos 5 Not Estab. %   Basos 1 Not Estab. %   Neutrophils Absolute 4.1 1.4 - 7.0 x10E3/uL   Lymphocytes Absolute 2.5 0.7 - 3.1 x10E3/uL   Monocytes Absolute 0.7 0.1 - 0.9 x10E3/uL   EOS (ABSOLUTE) 0.4 0.0 - 0.4 x10E3/uL   Basophils Absolute 0.0 0.0 - 0.2 x10E3/uL   Immature Granulocytes 0 Not Estab. %   Immature Grans (Abs) 0.0 0.0 - 0.1 x10E3/uL  Comprehensive metabolic panel  Result Value Ref Range   Glucose 147 (H) 70 - 99 mg/dL   BUN 15 8 - 27 mg/dL   Creatinine, Ser 0.87 0.57 - 1.00 mg/dL   eGFR 76 >59 mL/min/1.73   BUN/Creatinine Ratio 17 12 - 28   Sodium 143 134 - 144 mmol/L   Potassium 4.2 3.5 - 5.2 mmol/L   Chloride 104 96 - 106 mmol/L   CO2 26 20 - 29 mmol/L   Calcium 9.4 8.7 - 10.3 mg/dL   Total Protein 6.5 6.0 - 8.5 g/dL   Albumin 4.1 3.8 - 4.9 g/dL   Globulin, Total 2.4 1.5 - 4.5 g/dL   Albumin/Globulin Ratio 1.7 1.2 - 2.2    Bilirubin Total 0.2 0.0 - 1.2 mg/dL   Alkaline Phosphatase 92 44 - 121 IU/L   AST 8 0 - 40 IU/L   ALT 13 0 -  32 IU/L  Lipid panel  Result Value Ref Range   Cholesterol, Total 124 100 - 199 mg/dL   Triglycerides 51 0 - 149 mg/dL   HDL 42 >39 mg/dL   VLDL Cholesterol Cal 12 5 - 40 mg/dL   LDL Chol Calc (NIH) 70 0 - 99 mg/dL   Chol/HDL Ratio 3.0 0.0 - 4.4 ratio  POCT glycosylated hemoglobin (Hb A1C)  Result Value Ref Range   Hemoglobin A1C 7.0 (A) 4.0 - 5.6 %   HbA1c POC (<> result, manual entry)     HbA1c, POC (prediabetic range)     HbA1c, POC (controlled diabetic range)         The ASCVD Risk score (Arnett DK, et al., 2019) failed to calculate for the following reasons:   The valid total cholesterol range is 130 to 320 mg/dL    Assessment & Plan:   Problem List Items Addressed This Visit       Endocrine   Type 2 diabetes mellitus with hyperglycemia, with long-term current use of insulin (HCC)   Relevant Medications   metFORMIN (GLUCOPHAGE) 1000 MG tablet   Dulaglutide (TRULICITY) 6.25 WL/8.9HT SOPN     Other   Tinnitus of both ears - Primary   Bilateral wrist pain    Meds ordered this encounter  Medications   metFORMIN (GLUCOPHAGE) 1000 MG tablet    Sig: Take 1 tablet (1,000 mg total) by mouth daily with breakfast.    Dispense:  90 tablet    Refill:  1    No further refills until appointment    Order Specific Question:   Supervising Provider    Answer:   Denita Lung [[id number]]   Dulaglutide (TRULICITY) 3.42 AJ/6.8TL SOPN    Sig: INJECT 0.75 MG UNDER THE SKIN ONCE A WEEK    Dispense:  2 mL    Refill:  2    Must have appointment for refills    Order Specific Question:   Supervising Provider    Answer:   Denita Lung [[id number]]    Follow-up: Return in about 6 months (around 02/03/2022) for Return for a Follow Up Appointment Chronic Disease.    Irene Pap, PA-C

## 2021-08-03 ENCOUNTER — Encounter: Payer: Self-pay | Admitting: Physician Assistant

## 2021-08-03 ENCOUNTER — Other Ambulatory Visit: Payer: Self-pay | Admitting: Medical

## 2021-08-03 ENCOUNTER — Ambulatory Visit (INDEPENDENT_AMBULATORY_CARE_PROVIDER_SITE_OTHER): Payer: BC Managed Care – PPO | Admitting: Physician Assistant

## 2021-08-03 ENCOUNTER — Other Ambulatory Visit: Payer: Self-pay | Admitting: Internal Medicine

## 2021-08-03 VITALS — BP 140/80 | HR 50 | Ht 67.5 in | Wt 266.4 lb

## 2021-08-03 DIAGNOSIS — E1169 Type 2 diabetes mellitus with other specified complication: Secondary | ICD-10-CM

## 2021-08-03 DIAGNOSIS — E1165 Type 2 diabetes mellitus with hyperglycemia: Secondary | ICD-10-CM

## 2021-08-03 DIAGNOSIS — M25531 Pain in right wrist: Secondary | ICD-10-CM | POA: Insufficient documentation

## 2021-08-03 DIAGNOSIS — H9313 Tinnitus, bilateral: Secondary | ICD-10-CM | POA: Diagnosis not present

## 2021-08-03 DIAGNOSIS — I10 Essential (primary) hypertension: Secondary | ICD-10-CM

## 2021-08-03 DIAGNOSIS — M25532 Pain in left wrist: Secondary | ICD-10-CM | POA: Diagnosis not present

## 2021-08-03 DIAGNOSIS — I152 Hypertension secondary to endocrine disorders: Secondary | ICD-10-CM

## 2021-08-03 DIAGNOSIS — H9193 Unspecified hearing loss, bilateral: Secondary | ICD-10-CM

## 2021-08-03 DIAGNOSIS — Z794 Long term (current) use of insulin: Secondary | ICD-10-CM

## 2021-08-03 MED ORDER — METFORMIN HCL 1000 MG PO TABS: 1000.0000 mg | ORAL_TABLET | Freq: Every day | ORAL | 1 refills | Status: DC

## 2021-08-03 MED ORDER — TRULICITY 0.75 MG/0.5ML ~~LOC~~ SOAJ: SUBCUTANEOUS | 2 refills | Status: DC

## 2021-08-03 NOTE — Patient Instructions (Addendum)
You can try OTC Flavonoid for ringing in your ears and drink 64 ounces of water a day   You can take OTC pain medicine as needed:  Tylenol (generic is acetamenophen)  Advil or Motrin (generic is ibuprofen) ALWAYS TAKE WITH FOOD Aleve (generic is naprosyn sodium) ALWAYS TAKE WITH FOOD  Aspercreme with lidocaine Muscle rubs like Biofreeze, IcyHot, Bengay, pain patches like SalonPas  Voltaren gel (generic is Diclofenac sodium)  DISEASES & CONDITIONS Arthritis of the Thumb  Arthritis is a condition that affects joints, ultimately resulting in wearing out of the protective cartilage joint surface. In the thumb, the most common site for arthritis to develop is in the basal joint at the base of the thumb -- also known as the thumb carpometacarpal (CMC) joint.  Although there are several types of arthritis, the one that most often affects the basal joint is osteoarthritis (degenerative, or wear-and-tear, arthritis).  Description Illustration of arthritis at the base of the thum Smooth cartilage covers the ends of the bones. It enables the bones to glide easily in the joint. Without it, bones rub against each other, causing friction and damage. Osteoarthritis occurs when the cartilage begins to wear away, typically due to age and use.  The basal joint at the base of the thumb -- or thumb CMC joint -- is located near the wrist and at the fleshy part of the thumb. It enables the thumb to swivel, pivot, and pinch so that you can grip things in your hand. This joint is highly vulnerable to arthritis as people age.  Arthritis of the base of the thumb is more common in women than in men, and usually occurs after 61 years of age. Prior fractures or other injuries to the joint may increase the likelihood of developing this condition.  Symptoms Pain with activities that involve gripping or pinching, such as turning a key, opening a door, or snapping your fingers Loss of strength in gripping or pinching  activities Swelling and tenderness at the base of the thumb Aching discomfort after prolonged use of the thumb An enlarged, "out-of-joint" appearance at the base of the thumb Development of a bony prominence or bump over the basal joint at the base of the thumb Limited motion in the thumb

## 2021-08-03 NOTE — Assessment & Plan Note (Signed)
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

## 2021-08-03 NOTE — Assessment & Plan Note (Signed)
Stable, stay hydrated, can try OTC Flavanoids supplement

## 2021-08-03 NOTE — Assessment & Plan Note (Signed)
controlled, eat a low sugar diet, avoid starchy food with a lot of carbohydrates, avoid fried and processed foods; last hgb a1c 7.0 on 04/27/2021

## 2021-08-18 ENCOUNTER — Telehealth: Payer: Self-pay

## 2021-08-18 NOTE — Telephone Encounter (Signed)
P.A. TRULICITY approved til 3/36/12

## 2022-01-17 ENCOUNTER — Other Ambulatory Visit: Payer: Self-pay | Admitting: Physician Assistant

## 2022-01-17 DIAGNOSIS — E1165 Type 2 diabetes mellitus with hyperglycemia: Secondary | ICD-10-CM

## 2022-02-08 ENCOUNTER — Encounter: Payer: BC Managed Care – PPO | Admitting: Physician Assistant

## 2022-03-06 ENCOUNTER — Other Ambulatory Visit: Payer: Self-pay | Admitting: Physician Assistant

## 2022-03-06 ENCOUNTER — Other Ambulatory Visit: Payer: Self-pay | Admitting: Medical

## 2022-03-06 DIAGNOSIS — E1169 Type 2 diabetes mellitus with other specified complication: Secondary | ICD-10-CM

## 2022-03-06 DIAGNOSIS — H9193 Unspecified hearing loss, bilateral: Secondary | ICD-10-CM

## 2022-03-06 DIAGNOSIS — I10 Essential (primary) hypertension: Secondary | ICD-10-CM

## 2022-03-06 DIAGNOSIS — E1165 Type 2 diabetes mellitus with hyperglycemia: Secondary | ICD-10-CM

## 2022-03-06 DIAGNOSIS — E1159 Type 2 diabetes mellitus with other circulatory complications: Secondary | ICD-10-CM

## 2022-08-09 ENCOUNTER — Encounter: Payer: Self-pay | Admitting: *Deleted

## 2022-08-09 NOTE — Progress Notes (Signed)
[facility] Quality Team Note  Name: Jane Lopez Date of Birth: 1960-04-16 MRN: [id number] Date: 08/09/2022  [facility] Quality Team has reviewed this patient's chart, please see recommendations below:  AWV; Patient requests provider to schedule AWV. Please call patient back with appointment details. No open gap but pt requested to schedule AWV. Mammogram Scheduling; Patient requests provider office schedule mammogram screening at (no preference, will leave up to PCP).

## 2022-09-11 ENCOUNTER — Telehealth: Payer: Self-pay | Admitting: Internal Medicine

## 2022-09-11 DIAGNOSIS — E1165 Type 2 diabetes mellitus with hyperglycemia: Secondary | ICD-10-CM

## 2022-09-11 DIAGNOSIS — I10 Essential (primary) hypertension: Secondary | ICD-10-CM

## 2022-09-11 DIAGNOSIS — H9193 Unspecified hearing loss, bilateral: Secondary | ICD-10-CM

## 2022-09-11 DIAGNOSIS — E1159 Type 2 diabetes mellitus with other circulatory complications: Secondary | ICD-10-CM

## 2022-09-11 DIAGNOSIS — E1169 Type 2 diabetes mellitus with other specified complication: Secondary | ICD-10-CM

## 2022-09-11 MED ORDER — ATORVASTATIN CALCIUM 20 MG PO TABS: 20.0000 mg | ORAL_TABLET | Freq: Every day | ORAL | 0 refills | Status: DC

## 2022-09-11 MED ORDER — LOSARTAN POTASSIUM-HCTZ 100-25 MG PO TABS: 1.0000 | ORAL_TABLET | Freq: Every day | ORAL | 0 refills | Status: DC

## 2022-09-11 MED ORDER — AMLODIPINE BESYLATE 10 MG PO TABS: 10.0000 mg | ORAL_TABLET | Freq: Every day | ORAL | 0 refills | Status: DC

## 2022-09-11 MED ORDER — TRULICITY 0.75 MG/0.5ML ~~LOC~~ SOAJ: SUBCUTANEOUS | 0 refills | Status: DC

## 2022-09-11 MED ORDER — METFORMIN HCL 1000 MG PO TABS: ORAL_TABLET | ORAL | 0 refills | Status: DC

## 2022-09-11 NOTE — Telephone Encounter (Signed)
Pt has upcoming appt but needs refillls on all meds

## 2022-09-14 DIAGNOSIS — Z833 Family history of diabetes mellitus: Secondary | ICD-10-CM | POA: Diagnosis not present

## 2022-09-14 DIAGNOSIS — F17210 Nicotine dependence, cigarettes, uncomplicated: Secondary | ICD-10-CM | POA: Diagnosis not present

## 2022-09-14 DIAGNOSIS — E1151 Type 2 diabetes mellitus with diabetic peripheral angiopathy without gangrene: Secondary | ICD-10-CM | POA: Diagnosis not present

## 2022-09-14 DIAGNOSIS — R32 Unspecified urinary incontinence: Secondary | ICD-10-CM | POA: Diagnosis not present

## 2022-09-14 DIAGNOSIS — Z7984 Long term (current) use of oral hypoglycemic drugs: Secondary | ICD-10-CM | POA: Diagnosis not present

## 2022-09-14 DIAGNOSIS — Z880 Allergy status to penicillin: Secondary | ICD-10-CM | POA: Diagnosis not present

## 2022-09-14 DIAGNOSIS — G8929 Other chronic pain: Secondary | ICD-10-CM | POA: Diagnosis not present

## 2022-09-14 DIAGNOSIS — I10 Essential (primary) hypertension: Secondary | ICD-10-CM | POA: Diagnosis not present

## 2022-09-14 DIAGNOSIS — Z8249 Family history of ischemic heart disease and other diseases of the circulatory system: Secondary | ICD-10-CM | POA: Diagnosis not present

## 2022-09-14 DIAGNOSIS — Z6841 Body Mass Index (BMI) 40.0 and over, adult: Secondary | ICD-10-CM | POA: Diagnosis not present

## 2022-09-14 DIAGNOSIS — E785 Hyperlipidemia, unspecified: Secondary | ICD-10-CM | POA: Diagnosis not present

## 2022-09-16 ENCOUNTER — Ambulatory Visit (INDEPENDENT_AMBULATORY_CARE_PROVIDER_SITE_OTHER): Payer: 59 | Admitting: Nurse Practitioner

## 2022-09-16 ENCOUNTER — Encounter: Payer: Self-pay | Admitting: Nurse Practitioner

## 2022-09-16 VITALS — BP 132/84 | HR 69 | Ht 67.25 in | Wt 274.8 lb

## 2022-09-16 DIAGNOSIS — Z1231 Encounter for screening mammogram for malignant neoplasm of breast: Secondary | ICD-10-CM

## 2022-09-16 DIAGNOSIS — I152 Hypertension secondary to endocrine disorders: Secondary | ICD-10-CM

## 2022-09-16 DIAGNOSIS — I499 Cardiac arrhythmia, unspecified: Secondary | ICD-10-CM

## 2022-09-16 DIAGNOSIS — Z79899 Other long term (current) drug therapy: Secondary | ICD-10-CM

## 2022-09-16 DIAGNOSIS — F17200 Nicotine dependence, unspecified, uncomplicated: Secondary | ICD-10-CM

## 2022-09-16 DIAGNOSIS — E782 Mixed hyperlipidemia: Secondary | ICD-10-CM

## 2022-09-16 DIAGNOSIS — E1165 Type 2 diabetes mellitus with hyperglycemia: Secondary | ICD-10-CM | POA: Diagnosis not present

## 2022-09-16 DIAGNOSIS — E1169 Type 2 diabetes mellitus with other specified complication: Secondary | ICD-10-CM | POA: Diagnosis not present

## 2022-09-16 DIAGNOSIS — Z0000 Encounter for general adult medical examination without abnormal findings: Secondary | ICD-10-CM | POA: Insufficient documentation

## 2022-09-16 DIAGNOSIS — E118 Type 2 diabetes mellitus with unspecified complications: Secondary | ICD-10-CM

## 2022-09-16 DIAGNOSIS — E1159 Type 2 diabetes mellitus with other circulatory complications: Secondary | ICD-10-CM | POA: Diagnosis not present

## 2022-09-16 DIAGNOSIS — Z794 Long term (current) use of insulin: Secondary | ICD-10-CM

## 2022-09-16 HISTORY — DX: Cardiac arrhythmia, unspecified: I49.9

## 2022-09-16 LAB — POCT UA - MICROALBUMIN
Albumin/Creatinine Ratio, Urine, POC: 4.4
Creatinine, POC: 192.1 mg/dL
Microalbumin Ur, POC: 8.4 mg/L

## 2022-09-16 LAB — IRON,TIBC AND FERRITIN PANEL

## 2022-09-16 LAB — LIPID PANEL

## 2022-09-16 LAB — HEMOGLOBIN A1C

## 2022-09-16 LAB — VITAMIN D 25 HYDROXY (VIT D DEFICIENCY, FRACTURES)

## 2022-09-16 MED ORDER — OZEMPIC (0.25 OR 0.5 MG/DOSE) 2 MG/1.5ML ~~LOC~~ SOPN
PEN_INJECTOR | SUBCUTANEOUS | 0 refills | Status: DC
Start: 2022-09-16 — End: 2022-10-16

## 2022-09-16 NOTE — Assessment & Plan Note (Signed)
BMI 42.72 today in the setting of DM, HTN, HLD, and increased CVD risks with smoking history.  Plan: - Labs pending - Diet and exercise recommendations provided - Strongly recommend physical activity to help with overall health management.

## 2022-09-16 NOTE — Assessment & Plan Note (Signed)
Heart rhythm irregular today with normal rate. EKG showed PACs and possible PVCs present. We discussed monitoring her labs to determine if a cause could be identified. She is asymptomatic at this time and is in agreement to this.  Plan: - EKG today reassuring - Labs pending - Consider Xeo patch if needed and cardiology referral if labs are not clear

## 2022-09-16 NOTE — Assessment & Plan Note (Signed)
Referral for screening for lung Ca sent.

## 2022-09-16 NOTE — Assessment & Plan Note (Signed)
Chronic concerns for lipids, HTN, obesity, and blood sugars for patient. At this time she has been off of trulicity for several weeks. We discussed restarting this vs. Ozempic. It is unclear if her formulary covers Ozempic, but she was not at goal while on the trulicity, therefore Ozempic would be a good option for co-morbidities.  Plan: - labs pending - restart trulicity, will work to see if we can get coverage for ozempic instead.  - Continue with metformin at this time - Work on diet and exercise information provided on AVS for optimal management.  - Will plan f/u in 3-6 months based on labs.

## 2022-09-16 NOTE — Assessment & Plan Note (Signed)
Chronic hypertension currently managed with amlodipine, losartan-hydrochlorothiazide. She has been out of some of her medications due to being lost to follow-up.  Plan: - Labs today - Restart medications and monitor closely.  - F/U in 6 months or sooner based on labs.

## 2022-09-16 NOTE — Assessment & Plan Note (Signed)
Chronic elevation in lipids in the setting of DM, HTN, and obesity. Previously taking atorvastatin for management- unclear how long she has been off of the medications.  Plan: - labs today - restart atorvastatin, will consider changes based on labs - goal LDL less than 70

## 2022-09-16 NOTE — Patient Instructions (Addendum)
We will see what your labs show and determine if there is a cause for the irregular heart beat. We will get to the bottom of this :)   WEIGHT LOSS   PLANNING  For best management of weight, it is vital to balance intake versus output. This means the number of calories burned per day must be less than the calories you take in with food and drink.   I recommend trying to follow a diet with the following: Calories: 1200-1500 calories per day Carbohydrates: 150-180 grams of carbohydrates per day  Why: Gives your body enough "quick fuel" for cells to maintain normal function without sending them into starvation mode.  Protein: At least 90 grams of protein per day- 30 grams with each meal Why: Protein takes longer and uses more energy than carbohydrates to break down for fuel. The carbohydrates in your meals serves as quick energy sources and proteins help use some of that extra quick energy to break down to produce long term energy. This helps you not feel hungry as quickly and protein breakdown burns calories.  Water: Drink AT LEAST 64 ounces of water per day  Why: Water is essential to healthy metabolism. Water helps to fill the stomach and keep you fuller longer. Water is required for healthy digestion and filtering of waste in the body.  Fat: Limit fats in your diet- when choosing fats, choose foods with lower fats content such as lean meats (chicken, fish, [place]).  Why: Increased fat intake leads to storage "for later". Once you burn your carbohydrate energy, your body goes into fat and protein breakdown mode to help you loose weight.  Cholesterol: Fats and oils that are LIQUID at room temperature are best. Choose vegetable oils (olive oil, avocado oil, nuts). Avoid fats that are SOLID at room temperature (animal fats, processed meats). Healthy fats are often found in whole grains, beans, nuts, seeds, and berries.  Why: Elevated cholesterol levels lead to build up of cholesterol on the inside of  your blood vessels. This will eventually cause the blood vessels to become hard and can lead to high blood pressure and damage to your organs. When the blood flow is reduced, but the pressure is high from cholesterol buildup, parts of the cholesterol can break off and form clots that can go to the brain or heart leading to a stroke or heart attack.  Fiber: Increase amount of SOLUBLE the fiber in your diet. This helps to fill you up, lowers cholesterol, and helps with digestion. Some foods high in soluble fiber are oats, peas, beans, apples, carrots, barley, and citrus fruits.   Why: Fiber fills you up, helps remove excess cholesterol, and aids in healthy digestion which are all very important in weight management.   I recommend the following as a minimum activity routine: Purposeful walk or other physical activity at least 20 minutes every single day. This means purposefully taking a walk, jog, bike, swim, treadmill, elliptical, dance, etc.  This activity should be ABOVE your normal daily activities, such as walking at work. Goal exercise should be at least 150 minutes a week- work your way up to this.   Heart Rate: Your maximum exercise heart rate should be 220 - Your Age in Years. When exercising, get your heart rate up, but avoid going over the maximum targeted heart rate.  60-70% of your maximum heart rate is where you tend to burn the most fat. To find this number:  220 - Age In Years= Max HR  Max HR x 0.6 (or 0.7) = Fat Burning HR The Fat Burning HR is your goal heart rate while working out to burn the most fat.  NEVER exercise to the point your feel lightheaded, weak, nauseated, dizzy. If you experience ANY of these symptoms- STOP exercise! Allow yourself to cool down and your heart rate to come down. Then restart slower next time.  If at ANY TIME you feel chest pain or chest pressure during exercise, STOP IMMEDIATELY and seek medical attention.

## 2022-09-16 NOTE — Progress Notes (Signed)
Jane Clamp, DNP, AGNP-c [facility] [address] [place], [place] [address] Main Office [phone]  BP 132/84   Pulse 69   Ht 5' 7.25" (1.708 m)   Wt 274 lb 12.8 oz (124.6 kg)   BMI 42.72 kg/m    Subjective:    Patient ID: Jane Lopez, female    DOB: 1960/11/18, 62 y.o.   MRN: [id number]  HPI: Jane Lopez is a 62 y.o. female presenting on 09/16/2022 for comprehensive medical examination.   Current medical concerns include: Diabetes Management The patient, Jane Lopez, reports needing a refill on her Trulicity prescription. She had a recent wellness visit where Ozempic was recommended, but she is unsure about her current blood sugar levels. She has been trying a no-carb diet and noticed weight gain after losing her health insurance and running out of Trulicity. She is not currently checking her blood sugars. The patient has been experiencing increased urination and fatigue, which she attributes to her blood sugar being out of control. She reports when these symptoms present she adjusts her diet to help reduce the numbers.  She has been off Trulicity for a couple of months.  Eye Health Jane Lopez had an eye exam last month, where Jane Lopez cataracts were detected, and her provider is monitoring the situation.  Lung Health She has not had a lung cancer screening before but is interested in having one. She has a smoking history since "as long as I can remember". She is a current, every day smoker.   Neurological Symptoms The patient is experiencing numbness in her right big toe, which has been ongoing for a little more than a month.   The patient also reports occasional swelling in her feet and ankles.    Pertinent items are noted in HPI.  IMMUNIZATIONS:   Flu: Flu vaccine postponed until flu season Prevnar 13: Prevnar 13 declined, patient will complete at a later date Prevnar 20: Prevnar 20 declined, patient will complete at a later  date Pneumovax 23: Pneumovax declined, patient will complete at a later date Vac Shingrix: Shingrix declined, patient will complete at a later date HPV: HPV N/A for this patient Tetanus: Tetanus completed in the last 10 years COVID: COVID declined, patient will complete at a later date  HEALTH MAINTENANCE: Pap Smear HM Status: is up to date Mammogram HM Status: ordered today Colon Cancer Screening HM Status: is up to date Bone Density HM Status: is up to date STI Testing HM Status: was declined  Lung CT HM Status: ordered today  She reports regular vision exams q1-5y: Yes  Last exam 1 month ago She reports regular dental exams q 94m:  Yes  She reports eating a variety of foods, some healthy and some not healthy. When her blood sugars are elevated she focuses on healthy options. . She endorses exercise and/or activity of: Sedentary   Most Recent Depression Screen:     09/16/2022    1:38 PM 04/27/2021    9:01 AM 06/16/2020    8:10 AM 08/13/2019    8:52 AM 05/07/2018    3:18 PM  Depression screen PHQ 2/9  Decreased Interest 0 0 0 3 0  Down, Depressed, Hopeless 0 0 0 3 0  PHQ - 2 Score 0 0 0 6 0  Altered sleeping    3   Tired, decreased energy    3   Change in appetite    3   Feeling bad or failure about yourself     3  Trouble concentrating    3   Moving slowly or fidgety/restless    0   Suicidal thoughts    0   PHQ-9 Score    21   Difficult doing work/chores    Very difficult    Most Recent Anxiety Screen:      No data to display         Most Recent Fall Screen:    09/16/2022    1:38 PM 06/16/2020    8:10 AM 08/13/2019    8:51 AM 05/07/2018    3:18 PM  Fall Risk   Falls in the past year? 0 0 0 0  Number falls in past yr: 0 0 0 0  Injury with Fall? 0 0 0 0  Risk for fall due to : No Fall Risks No Fall Risks    Follow up Falls evaluation completed Falls evaluation completed      Past medical history, surgical history, medications, allergies, family history and social  history reviewed with patient today and changes made to appropriate areas of the chart.  Past Medical History:  Past Medical History:  Diagnosis Date   Allergy    seasonal allergies   Diabetes (HCC)    Dyslipidemia 05/12/2020   Elevated LDL cholesterol level    Headache 11/10/2007   Qualifier: Diagnosis of   By: Linna Darner, CMA, Cindy      Replacing diagnoses that were inactivated after the 06/10/22 regulatory import   Hyperlipidemia    on meds   Hypertension    on meds   Intermittent palpitations 05/07/2018   Medications:  Current Outpatient Medications on File Prior to Visit  Medication Sig   amLODipine (NORVASC) 10 MG tablet Take 1 tablet (10 mg total) by mouth daily.   atorvastatin (LIPITOR) 20 MG tablet Take 1 tablet (20 mg total) by mouth daily.   Dulaglutide (TRULICITY) 0.75 MG/0.5ML SOPN INJECT 0.75 MG UNDER THE SKIN ONCE A WEEK.   Insulin Pen Needle 32G X 4 MM MISC Use with insulin medication   losartan-hydrochlorothiazide (HYZAAR) 100-25 MG tablet Take 1 tablet by mouth daily.   metFORMIN (GLUCOPHAGE) 1000 MG tablet TAKE 1 TABLET DAILY WITH BREAKFAST. NO FURTHER REFILLS UNTIL APPOINTMENT.   No current facility-administered medications on file prior to visit.   Surgical History:  Past Surgical History:  Procedure Laterality Date   ABDOMINAL HYSTERECTOMY     APPENDECTOMY     Allergies:  Allergies  Allergen Reactions   Penicillins Rash   Family History:  Family History  Problem Relation Age of Onset   Hypertension Mother    Diabetes Father    Renal Disease Father    Diabetes Sister    HIV Sister    Heart attack Maternal Grandmother    Colon polyps Neg Hx    Colon cancer Neg Hx    Esophageal cancer Neg Hx    Rectal cancer Neg Hx    Stomach cancer Neg Hx        Objective:    BP 132/84   Pulse 69   Ht 5' 7.25" (1.708 m)   Wt 274 lb 12.8 oz (124.6 kg)   BMI 42.72 kg/m   Wt Readings from Last 3 Encounters:  09/16/22 274 lb 12.8 oz (124.6 kg)  08/03/21  266 lb 6.4 oz (120.8 kg)  04/27/21 264 lb 3.2 oz (119.8 kg)    Physical Exam Vitals and nursing note reviewed.  Constitutional:      General: She is not in acute distress.  Appearance: Normal appearance.  HENT:     Head: Normocephalic and atraumatic.     Right Ear: Hearing, tympanic membrane, ear canal and external ear normal.     Left Ear: Hearing, tympanic membrane, ear canal and external ear normal.     Nose: Nose normal.     Right Sinus: No maxillary sinus tenderness or frontal sinus tenderness.     Left Sinus: No maxillary sinus tenderness or frontal sinus tenderness.     Mouth/Throat:     Lips: Pink.     Mouth: Mucous membranes are moist.     Pharynx: Oropharynx is clear.  Eyes:     General: Lids are normal. Vision grossly intact.     Extraocular Movements: Extraocular movements intact.     Conjunctiva/sclera: Conjunctivae normal.     Pupils: Pupils are equal, round, and reactive to light.     Funduscopic exam:    Right eye: Red reflex present.        Left eye: Red reflex present.    Visual Fields: Right eye visual fields normal and left eye visual fields normal.  Neck:     Thyroid: No thyromegaly.     Vascular: No carotid bruit.  Cardiovascular:     Rate and Rhythm: Normal rate. Rhythm irregular.     Chest Wall: PMI is not displaced.     Pulses:          Dorsalis pedis pulses are 2+ on the right side and 2+ on the left side.       Posterior tibial pulses are 2+ on the right side and 2+ on the left side.     Heart sounds: Normal heart sounds. No murmur heard.    Comments: Chronic additional beats auscultated on examination. Patient confirmed no symptoms and no history of being told her heart rhythm was irregular. Rate normal.   EKG performed which shows PAC and possible PVC's throughout the strip.  Pulmonary:     Effort: Pulmonary effort is normal. No respiratory distress.     Breath sounds: Normal breath sounds.  Abdominal:     General: Abdomen is flat. Bowel  sounds are normal. There is no distension.     Palpations: Abdomen is soft. There is no hepatomegaly, splenomegaly or mass.     Tenderness: There is no abdominal tenderness. There is no right CVA tenderness, left CVA tenderness, guarding or rebound.  Musculoskeletal:        General: Normal range of motion.     Cervical back: Full passive range of motion without pain, normal range of motion and neck supple. No tenderness.     Right lower leg: No edema.     Left lower leg: No edema.  Feet:     Left foot:     Toenail Condition: Left toenails are normal.  Lymphadenopathy:     Cervical: No cervical adenopathy.     Upper Body:     Right upper body: No supraclavicular adenopathy.     Left upper body: No supraclavicular adenopathy.  Skin:    General: Skin is warm and dry.     Capillary Refill: Capillary refill takes less than 2 seconds.     Nails: There is no clubbing.  Neurological:     General: No focal deficit present.     Mental Status: She is alert and oriented to person, place, and time.     GCS: GCS eye subscore is 4. GCS verbal subscore is 5. GCS motor subscore is 6.  Sensory: Sensation is intact.     Motor: Motor function is intact.     Coordination: Coordination is intact.     Gait: Gait is intact.     Deep Tendon Reflexes: Reflexes are normal and symmetric.  Psychiatric:        Attention and Perception: Attention normal.        Mood and Affect: Mood normal.        Speech: Speech normal.        Behavior: Behavior normal. Behavior is cooperative.        Thought Content: Thought content normal.        Cognition and Memory: Cognition and memory normal.        Judgment: Judgment normal.     Results for orders placed or performed in visit on 09/16/22  CBC with Differential/Platelet  Result Value Ref Range   WBC 7.9 3.4 - 10.8 x10E3/uL   RBC 4.60 3.77 - 5.28 x10E6/uL   Hemoglobin 12.7 11.1 - 15.9 g/dL   Hematocrit 62.8 31.5 - 46.6 %   MCV 82 79 - 97 fL   MCH 27.6 26.6 -  33.0 pg   MCHC 33.5 31.5 - 35.7 g/dL   RDW 17.6 16.0 - 73.7 %   Platelets 259 150 - 450 x10E3/uL   Neutrophils 55 Not Estab. %   Lymphs 34 Not Estab. %   Monocytes 7 Not Estab. %   Eos 3 Not Estab. %   Basos 1 Not Estab. %   Neutrophils Absolute 4.4 1.4 - 7.0 x10E3/uL   Lymphocytes Absolute 2.7 0.7 - 3.1 x10E3/uL   Monocytes Absolute 0.5 0.1 - 0.9 x10E3/uL   EOS (ABSOLUTE) 0.3 0.0 - 0.4 x10E3/uL   Basophils Absolute 0.0 0.0 - 0.2 x10E3/uL   Immature Granulocytes 0 Not Estab. %   Immature Grans (Abs) 0.0 0.0 - 0.1 x10E3/uL  CMP14+EGFR  Result Value Ref Range   Glucose WILL FOLLOW    BUN WILL FOLLOW    Creatinine, Ser WILL FOLLOW    eGFR WILL FOLLOW    BUN/Creatinine Ratio WILL FOLLOW    Sodium WILL FOLLOW    Potassium WILL FOLLOW    Chloride WILL FOLLOW    CO2 WILL FOLLOW    Calcium WILL FOLLOW    Total Protein WILL FOLLOW    Albumin WILL FOLLOW    Globulin, Total WILL FOLLOW    Bilirubin Total WILL FOLLOW    Alkaline Phosphatase WILL FOLLOW    AST WILL FOLLOW    ALT WILL FOLLOW   Hemoglobin A1c  Result Value Ref Range   Hgb A1c MFr Bld WILL FOLLOW    Est. average glucose Bld gHb Est-mCnc WILL FOLLOW   Lipid panel  Result Value Ref Range   Cholesterol, Total WILL FOLLOW    Triglycerides WILL FOLLOW    HDL WILL FOLLOW    VLDL Cholesterol Cal WILL FOLLOW    LDL Chol Calc (NIH) WILL FOLLOW    LDL CALC COMMENT: WILL FOLLOW    Chol/HDL Ratio WILL FOLLOW   VITAMIN D 25 Hydroxy (Vit-D Deficiency, Fractures)  Result Value Ref Range   Vit D, 25-Hydroxy WILL FOLLOW   TSH + free T4  Result Value Ref Range   TSH WILL FOLLOW    Free T4 WILL FOLLOW   Iron, TIBC and Ferritin Panel  Result Value Ref Range   Total Iron Binding Capacity WILL FOLLOW    UIBC WILL FOLLOW    Iron WILL FOLLOW  Iron Saturation WILL FOLLOW    Ferritin WILL FOLLOW   Vitamin B12  Result Value Ref Range   Vitamin B-12 WILL FOLLOW   POCT UA - Microalbumin  Result Value Ref Range   Microalbumin  Ur, POC 8.4 mg/L   Creatinine, POC 192.1 mg/dL   Albumin/Creatinine Ratio, Urine, POC 4.4          Assessment & Plan:   Problem List Items Addressed This Visit     Hypertension associated with diabetes (HCC)    Chronic hypertension currently managed with amlodipine, losartan-hydrochlorothiazide. She has been out of some of her medications due to being lost to follow-up.  Plan: - Labs today - Restart medications and monitor closely.  - F/U in 6 months or sooner based on labs.       Relevant Medications   Semaglutide,0.25 or 0.5MG /DOS, (OZEMPIC, 0.25 OR 0.5 MG/DOSE,) 2 MG/1.5ML SOPN   Other Relevant Orders   CBC with Differential/Platelet (Completed)   CMP14+EGFR (Completed)   Hemoglobin A1c (Completed)   Lipid panel (Completed)   POCT UA - Microalbumin (Completed)   Morbid obesity (HCC)    BMI 42.72 today in the setting of DM, HTN, HLD, and increased CVD risks with smoking history.  Plan: - Labs pending - Diet and exercise recommendations provided - Strongly recommend physical activity to help with overall health management.       Relevant Medications   Semaglutide,0.25 or 0.5MG /DOS, (OZEMPIC, 0.25 OR 0.5 MG/DOSE,) 2 MG/1.5ML SOPN   Other Relevant Orders   CBC with Differential/Platelet (Completed)   CMP14+EGFR (Completed)   Hemoglobin A1c (Completed)   Lipid panel (Completed)   POCT UA - Microalbumin (Completed)   Smoker    Referral for screening for lung Ca sent.       Relevant Orders   CBC with Differential/Platelet (Completed)   CMP14+EGFR (Completed)   Hemoglobin A1c (Completed)   Lipid panel (Completed)   Ambulatory Referral Lung Cancer Screening [facility]   Mixed hyperlipidemia due to type 2 diabetes mellitus (HCC)    Chronic elevation in lipids in the setting of DM, HTN, and obesity. Previously taking atorvastatin for management- unclear how long she has been off of the medications.  Plan: - labs today - restart atorvastatin, will consider  changes based on labs - goal LDL less than 70      Relevant Medications   Semaglutide,0.25 or 0.5MG /DOS, (OZEMPIC, 0.25 OR 0.5 MG/DOSE,) 2 MG/1.5ML SOPN   Other Relevant Orders   CBC with Differential/Platelet (Completed)   CMP14+EGFR (Completed)   Hemoglobin A1c (Completed)   Lipid panel (Completed)   POCT UA - Microalbumin (Completed)   Type 2 diabetes mellitus with complications (HCC)    Chronic concerns for lipids, HTN, obesity, and blood sugars for patient. At this time she has been off of trulicity for several weeks. We discussed restarting this vs. Ozempic. It is unclear if her formulary covers Ozempic, but she was not at goal while on the trulicity, therefore Ozempic would be a good option for co-morbidities.  Plan: - labs pending - restart trulicity, will work to see if we can get coverage for ozempic instead.  - Continue with metformin at this time - Work on diet and exercise information provided on AVS for optimal management.  - Will plan f/u in 3-6 months based on labs.       Relevant Medications   Semaglutide,0.25 or 0.5MG /DOS, (OZEMPIC, 0.25 OR 0.5 MG/DOSE,) 2 MG/1.5ML SOPN   Irregular heart beat    Heart  rhythm irregular today with normal rate. EKG showed PACs and possible PVCs present. We discussed monitoring her labs to determine if a cause could be identified. She is asymptomatic at this time and is in agreement to this.  Plan: - EKG today reassuring - Labs pending - Consider Xeo patch if needed and cardiology referral if labs are not clear      Relevant Orders   CBC with Differential/Platelet (Completed)   CMP14+EGFR (Completed)   Hemoglobin A1c (Completed)   Lipid panel (Completed)   VITAMIN D 25 Hydroxy (Vit-D Deficiency, Fractures) (Completed)   TSH + free T4 (Completed)   Iron, TIBC and Ferritin Panel (Completed)   Vitamin B12 (Completed)   Annual physical exam - Primary   Other Visit Diagnoses     Screening mammogram for breast cancer        Relevant Orders   MM 3D SCREENING MAMMOGRAM BILATERAL BREAST   Long-term use of high-risk medication       Relevant Orders   VITAMIN D 25 Hydroxy (Vit-D Deficiency, Fractures) (Completed)   TSH + free T4 (Completed)          Follow up plan: Return in about 6 months (around 03/19/2023) for Med Management 30: DM, HTN, HLD.  NEXT PREVENTATIVE PHYSICAL DUE IN 1 YEAR.  PATIENT COUNSELING PROVIDED FOR ALL ADULT PATIENTS: A well balanced diet low in saturated fats, cholesterol, and moderation in carbohydrates.  This can be as simple as monitoring portion sizes and cutting back on sugary beverages such as soda and juice to start with.    Daily water consumption of at least 64 ounces.  Physical activity at least 180 minutes per week.  If just starting out, start 10 minutes a day and work your way up.   This can be as simple as taking the stairs instead of the elevator and walking 2-3 laps around the office  purposefully every day.   STD protection, partner selection, and regular testing if high risk.  Limited consumption of alcoholic beverages if alcohol is consumed. For men, I recommend no more than 14 alcoholic beverages per week, spread out throughout the week (max 2 per day). Avoid "binge" drinking or consuming large quantities of alcohol in one setting.  Please let me know if you feel you may need help with reduction or quitting alcohol consumption.   Avoidance of nicotine, if used. Please let me know if you feel you may need help with reduction or quitting nicotine use.   Daily mental health attention. This can be in the form of 5 minute daily meditation, prayer, journaling, yoga, reflection, etc.  Purposeful attention to your emotions and mental state can significantly improve your overall wellbeing  and  Health.  Please know that I am here to help you with all of your health care goals and am happy to work with you to find a solution that works best for you.  The greatest  advice I have received with any changes in life are to take it one step at a time, that even means if all you can focus on is the next 60 seconds, then do that and celebrate your victories.  With any changes in life, you will have set backs, and that is OK. The important thing to remember is, if you have a set back, it is not a failure, it is an opportunity to try again! Screening Testing Mammogram Every 1 -2 years based on history and risk factors Starting at age 51 Pap Smear  Ages 21-39 every 3 years Ages 92-65 every 5 years with HPV testing More frequent testing may be required based on results and history Colon Cancer Screening Every 1-10 years based on test performed, risk factors, and history Starting at age 510 Bone Density Screening Every 2-10 years based on history Starting at age 45 for women Recommendations for men differ based on medication usage, history, and risk factors AAA Screening One time ultrasound Men 62-50 years old who have every smoked Lung Cancer Screening Low Dose Lung CT every 12 months Age 48-80 years with a 30 pack-year smoking history who still smoke or who have quit within the last 15 years   Screening Labs Routine  Labs: Complete Blood Count (CBC), Complete Metabolic Panel (CMP), Cholesterol (Lipid Panel) Every 6-12 months based on history and medications May be recommended more frequently based on current conditions or previous results Hemoglobin A1c Lab Every 3-12 months based on history and previous results Starting at age 65 or earlier with diagnosis of diabetes, high cholesterol, BMI >26, and/or risk factors Frequent monitoring for patients with diabetes to ensure blood sugar control Thyroid Panel (TSH) Every 6 months based on history, symptoms, and risk factors May be repeated more often if on medication HIV One time testing for all patients 10 and older May be repeated more frequently for patients with increased risk factors or  exposure Hepatitis C One time testing for all patients 53 and older May be repeated more frequently for patients with increased risk factors or exposure Gonorrhea, Chlamydia Every 12 months for all sexually active persons 13-24 years Additional monitoring may be recommended for those who are considered high risk or who have symptoms Every 12 months for any woman on birth control, regardless of sexual activity PSA Men 32-49 years old with risk factors Additional screening may be recommended from age 82-69 based on risk factors, symptoms, and history  Vaccine Recommendations Tetanus Booster All adults every 10 years Flu Vaccine All patients 6 months and older every year COVID Vaccine All patients 12 years and older Initial dosing with booster May recommend additional booster based on age and health history HPV Vaccine 2 doses all patients age 51-26 Dosing may be considered for patients over 26 Shingles Vaccine (Shingrix) 2 doses all adults 55 years and older Pneumonia (Pneumovax 19) All adults 65 years and older May recommend earlier dosing based on health history One year apart from Prevnar 55 Pneumonia (Prevnar 38) All adults 65 years and older Dosed 1 year after Pneumovax 23 Pneumonia (Prevnar 20) One time alternative to the two dosing of 13 and 23 For all adults with initial dose of 23, 20 is recommended 1 year later For all adults with initial dose of 13, 23 is still recommended as second option 1 year later

## 2022-09-17 LAB — CBC WITH DIFFERENTIAL/PLATELET
Basophils Absolute: 0 10*3/uL (ref 0.0–0.2)
Basos: 1 %
EOS (ABSOLUTE): 0.3 10*3/uL (ref 0.0–0.4)
Eos: 3 %
Hematocrit: 37.9 % (ref 34.0–46.6)
Hemoglobin: 12.7 g/dL (ref 11.1–15.9)
Immature Grans (Abs): 0 10*3/uL (ref 0.0–0.1)
Immature Granulocytes: 0 %
Lymphocytes Absolute: 2.7 10*3/uL (ref 0.7–3.1)
Lymphs: 34 %
MCH: 27.6 pg (ref 26.6–33.0)
MCHC: 33.5 g/dL (ref 31.5–35.7)
MCV: 82 fL (ref 79–97)
Monocytes Absolute: 0.5 10*3/uL (ref 0.1–0.9)
Monocytes: 7 %
Neutrophils Absolute: 4.4 10*3/uL (ref 1.4–7.0)
Neutrophils: 55 %
Platelets: 259 10*3/uL (ref 150–450)
RBC: 4.6 x10E6/uL (ref 3.77–5.28)
RDW: 14.3 % (ref 11.7–15.4)
WBC: 7.9 10*3/uL (ref 3.4–10.8)

## 2022-09-17 LAB — CMP14+EGFR
ALT: 11 IU/L (ref 0–32)
AST: 12 IU/L (ref 0–40)
Albumin: 4.3 g/dL (ref 3.9–4.9)
Alkaline Phosphatase: 99 IU/L (ref 44–121)
BUN/Creatinine Ratio: 18 (ref 12–28)
BUN: 17 mg/dL (ref 8–27)
Bilirubin Total: 0.3 mg/dL (ref 0.0–1.2)
CO2: 25 mmol/L (ref 20–29)
Calcium: 10.1 mg/dL (ref 8.7–10.3)
Chloride: 100 mmol/L (ref 96–106)
Creatinine, Ser: 0.97 mg/dL (ref 0.57–1.00)
Globulin, Total: 2.5 g/dL (ref 1.5–4.5)
Glucose: 128 mg/dL — ABNORMAL HIGH (ref 70–99)
Potassium: 4.3 mmol/L (ref 3.5–5.2)
Sodium: 140 mmol/L (ref 134–144)
Total Protein: 6.8 g/dL (ref 6.0–8.5)
eGFR: 66 mL/min/{1.73_m2} (ref >59)

## 2022-09-17 LAB — LIPID PANEL
Cholesterol, Total: 181 mg/dL (ref 100–199)
LDL Chol Calc (NIH): 117 mg/dL — ABNORMAL HIGH (ref 0–99)
Triglycerides: 85 mg/dL (ref 0–149)
VLDL Cholesterol Cal: 16 mg/dL (ref 5–40)

## 2022-09-17 LAB — VITAMIN B12: Vitamin B-12: 568 pg/mL (ref 232–1245)

## 2022-09-17 LAB — IRON,TIBC AND FERRITIN PANEL
Ferritin: 36 ng/mL (ref 15–150)
Iron Saturation: 18 % (ref 15–55)
Total Iron Binding Capacity: 368 ug/dL (ref 250–450)

## 2022-09-17 LAB — TSH+FREE T4
Free T4: 1.4 ng/dL (ref 0.82–1.77)
TSH: 1.39 u[IU]/mL (ref 0.450–4.500)

## 2022-09-17 LAB — HEMOGLOBIN A1C: Est. average glucose Bld gHb Est-mCnc: 235 mg/dL

## 2022-09-24 ENCOUNTER — Other Ambulatory Visit: Payer: Self-pay

## 2022-09-24 DIAGNOSIS — I499 Cardiac arrhythmia, unspecified: Secondary | ICD-10-CM

## 2022-10-11 ENCOUNTER — Other Ambulatory Visit: Payer: Self-pay | Admitting: Nurse Practitioner

## 2022-10-11 DIAGNOSIS — E1165 Type 2 diabetes mellitus with hyperglycemia: Secondary | ICD-10-CM

## 2022-10-16 ENCOUNTER — Other Ambulatory Visit: Payer: Self-pay | Admitting: Nurse Practitioner

## 2022-10-16 DIAGNOSIS — I152 Hypertension secondary to endocrine disorders: Secondary | ICD-10-CM

## 2022-10-16 DIAGNOSIS — E559 Vitamin D deficiency, unspecified: Secondary | ICD-10-CM

## 2022-10-16 DIAGNOSIS — E1165 Type 2 diabetes mellitus with hyperglycemia: Secondary | ICD-10-CM

## 2022-10-16 DIAGNOSIS — I10 Essential (primary) hypertension: Secondary | ICD-10-CM

## 2022-10-16 DIAGNOSIS — E1169 Type 2 diabetes mellitus with other specified complication: Secondary | ICD-10-CM

## 2022-10-16 DIAGNOSIS — H9193 Unspecified hearing loss, bilateral: Secondary | ICD-10-CM

## 2022-10-16 MED ORDER — METFORMIN HCL 1000 MG PO TABS: ORAL_TABLET | ORAL | 0 refills | Status: DC

## 2022-10-16 MED ORDER — VITAMIN D3 1.25 MG (50000 UT) PO TABS
1.0000 | ORAL_TABLET | ORAL | 1 refills | Status: DC
Start: 2022-10-16 — End: 2023-01-17

## 2022-10-16 MED ORDER — AMLODIPINE BESYLATE 10 MG PO TABS: 10.0000 mg | ORAL_TABLET | Freq: Every day | ORAL | 0 refills | Status: DC

## 2022-10-16 MED ORDER — ATORVASTATIN CALCIUM 20 MG PO TABS: 20.0000 mg | ORAL_TABLET | Freq: Every day | ORAL | 0 refills | Status: DC

## 2022-10-16 MED ORDER — LOSARTAN POTASSIUM-HCTZ 100-25 MG PO TABS: 1.0000 | ORAL_TABLET | Freq: Every day | ORAL | 0 refills | Status: DC

## 2022-10-18 ENCOUNTER — Ambulatory Visit
Admission: RE | Admit: 2022-10-18 | Discharge: 2022-10-18 | Disposition: A | Discharge status: Home / Self Care | Payer: 59 | Source: Ambulatory Visit | Attending: Nurse Practitioner | Admitting: Nurse Practitioner

## 2022-10-18 DIAGNOSIS — Z1231 Encounter for screening mammogram for malignant neoplasm of breast: Secondary | ICD-10-CM

## 2022-10-19 ENCOUNTER — Telehealth: Payer: Self-pay | Admitting: Nurse Practitioner

## 2022-10-21 NOTE — Telephone Encounter (Signed)
Records send in for P.A.

## 2022-10-23 NOTE — Telephone Encounter (Signed)
Called [facility] regarding P.A.Ozempic  [phone] states denied, pt needs trial of Victoza also,  can pt be switched?

## 2022-11-06 MED ORDER — LIRAGLUTIDE 18 MG/3ML ~~LOC~~ SOPN: PEN_INJECTOR | SUBCUTANEOUS | 0 refills | Status: DC

## 2022-11-06 NOTE — Telephone Encounter (Signed)
Called pt & she has been on the Trulicity & doing ok,  explained the denial & needs trial of both Trulicity & Victoza.  She wants to switch to Victoza, as she ultimately wants to get on Ozempic.   I went over dosage instructions and advised must stay on each dose at least 7 days or longer if needed or any side effects.  She was informed to stop the Trulicity.  Advised to check her BS after changing medication & to follow up in month

## 2022-11-19 ENCOUNTER — Other Ambulatory Visit: Payer: Self-pay | Admitting: Nurse Practitioner

## 2022-11-19 DIAGNOSIS — E1159 Type 2 diabetes mellitus with other circulatory complications: Secondary | ICD-10-CM

## 2022-11-19 DIAGNOSIS — I10 Essential (primary) hypertension: Secondary | ICD-10-CM

## 2022-11-19 DIAGNOSIS — E1169 Type 2 diabetes mellitus with other specified complication: Secondary | ICD-10-CM

## 2022-11-22 ENCOUNTER — Other Ambulatory Visit: Payer: Self-pay

## 2022-11-22 ENCOUNTER — Encounter: Payer: Self-pay | Admitting: Nurse Practitioner

## 2022-11-22 ENCOUNTER — Other Ambulatory Visit: Payer: Self-pay | Admitting: Nurse Practitioner

## 2022-11-22 DIAGNOSIS — E1159 Type 2 diabetes mellitus with other circulatory complications: Secondary | ICD-10-CM

## 2022-11-22 DIAGNOSIS — I10 Essential (primary) hypertension: Secondary | ICD-10-CM

## 2022-11-22 MED ORDER — LIRAGLUTIDE 18 MG/3ML ~~LOC~~ SOPN: PEN_INJECTOR | SUBCUTANEOUS | 0 refills | Status: DC

## 2022-11-22 MED ORDER — AMLODIPINE BESYLATE 10 MG PO TABS: 10.0000 mg | ORAL_TABLET | Freq: Every day | ORAL | 0 refills | Status: DC

## 2023-01-17 ENCOUNTER — Encounter: Payer: Self-pay | Admitting: Nurse Practitioner

## 2023-01-17 ENCOUNTER — Telehealth: Payer: Self-pay | Admitting: Nurse Practitioner

## 2023-01-17 ENCOUNTER — Other Ambulatory Visit: Payer: Self-pay

## 2023-01-17 ENCOUNTER — Ambulatory Visit: Payer: 59 | Admitting: Nurse Practitioner

## 2023-01-17 VITALS — BP 128/82 | HR 68 | Wt 271.0 lb

## 2023-01-17 DIAGNOSIS — E118 Type 2 diabetes mellitus with unspecified complications: Secondary | ICD-10-CM | POA: Diagnosis not present

## 2023-01-17 DIAGNOSIS — H9193 Unspecified hearing loss, bilateral: Secondary | ICD-10-CM

## 2023-01-17 DIAGNOSIS — I499 Cardiac arrhythmia, unspecified: Secondary | ICD-10-CM | POA: Diagnosis not present

## 2023-01-17 DIAGNOSIS — E1165 Type 2 diabetes mellitus with hyperglycemia: Secondary | ICD-10-CM

## 2023-01-17 DIAGNOSIS — Z794 Long term (current) use of insulin: Secondary | ICD-10-CM | POA: Diagnosis not present

## 2023-01-17 DIAGNOSIS — L659 Nonscarring hair loss, unspecified: Secondary | ICD-10-CM

## 2023-01-17 DIAGNOSIS — F17200 Nicotine dependence, unspecified, uncomplicated: Secondary | ICD-10-CM

## 2023-01-17 DIAGNOSIS — E782 Mixed hyperlipidemia: Secondary | ICD-10-CM | POA: Diagnosis not present

## 2023-01-17 DIAGNOSIS — E1169 Type 2 diabetes mellitus with other specified complication: Secondary | ICD-10-CM

## 2023-01-17 DIAGNOSIS — E1159 Type 2 diabetes mellitus with other circulatory complications: Secondary | ICD-10-CM | POA: Diagnosis not present

## 2023-01-17 DIAGNOSIS — E559 Vitamin D deficiency, unspecified: Secondary | ICD-10-CM | POA: Diagnosis not present

## 2023-01-17 DIAGNOSIS — I152 Hypertension secondary to endocrine disorders: Secondary | ICD-10-CM

## 2023-01-17 DIAGNOSIS — I10 Essential (primary) hypertension: Secondary | ICD-10-CM

## 2023-01-17 DIAGNOSIS — E611 Iron deficiency: Secondary | ICD-10-CM

## 2023-01-17 MED ORDER — METFORMIN HCL 1000 MG PO TABS: ORAL_TABLET | ORAL | 0 refills | Status: DC

## 2023-01-17 MED ORDER — AMLODIPINE BESYLATE 10 MG PO TABS: 10.0000 mg | ORAL_TABLET | Freq: Every day | ORAL | 0 refills | Status: DC

## 2023-01-17 MED ORDER — LOSARTAN POTASSIUM-HCTZ 100-25 MG PO TABS: 1.0000 | ORAL_TABLET | Freq: Every day | ORAL | 0 refills | Status: DC

## 2023-01-17 MED ORDER — OZEMPIC (0.25 OR 0.5 MG/DOSE) 2 MG/1.5ML ~~LOC~~ SOPN: PEN_INJECTOR | SUBCUTANEOUS | 0 refills | Status: DC

## 2023-01-17 MED ORDER — OZEMPIC (0.25 OR 0.5 MG/DOSE) 2 MG/1.5ML ~~LOC~~ SOPN
PEN_INJECTOR | SUBCUTANEOUS | 0 refills | Status: DC
Start: 2023-01-17 — End: 2023-01-17

## 2023-01-17 MED ORDER — VITAMIN D3 1.25 MG (50000 UT) PO TABS: 1.0000 | ORAL_TABLET | ORAL | 3 refills | Status: DC

## 2023-01-17 NOTE — Telephone Encounter (Signed)
Pt needs ozempic to be sent to [facility] - [place] ([place]), [place] - [address]. Current pharmacy is out of stock

## 2023-01-17 NOTE — Patient Instructions (Signed)
Lets see if the hair loss is caused by the ATORVASTATIN.  Stop this medication until you come  back to see me and we will see if we can get your hair to start growing back.   Restart your blood pressure medications.  I sent in for Ozempic. Stop the Victoza when you get this medication.

## 2023-01-17 NOTE — Progress Notes (Signed)
Jane Clamp, DNP, AGNP-c [facility]  [address] [place], [place] [address] [phone]  ESTABLISHED PATIENT- Chronic Health and/or Follow-Up Visit  Blood pressure 128/82, pulse 68, weight 271 lb (122.9 kg).    Jane Lopez is a 62 y.o. year old female presenting today for evaluation and management of chronic conditions.   Jane Lopez, a patient with a history of diabetes and hypertension, presents for a three-month follow-up. The patient reports a significant concern about hair loss, which she noticed primarily in the front of her scalp. She is unsure of the duration but notes that it has been ongoing for a while. She initially attributed the hair loss to her blood pressure medications, amlodipine and losartan, but is uncertain of the exact timeline of onset in relation to starting these medications.  The patient also reports a change in her medication regimen. She has stopped taking her blood pressure medications for a week due to concerns about her hair loss.  However, she continues to take her diabetes medication, metformin, and is interested in switching to a once-weekly medication, Ozempic.  In addition to her primary concerns, the patient reports constipation, which she was unaware could be a side effect of her diabetes medication, Victoza. She does report headaches this week for unknown reasons. She did take one of the blood pressure medications and the headache resolved, indicating this could have been caused by elevation in her blood pressure. She is not sure which medication she took to relieve the headache.   She also mentions experiencing tingling and numbness in her big toes, which she describes as intermittent. She denies any similar symptoms in her hands or any sores or wounds that are not healing.  The patient also mentions a recent eye exam, the results of which were reportedly normal. She has been trying to manage her weight through diet and  exercise but expresses frustration at not seeing significant weight loss despite her efforts.  Lastly, the patient reports that she has not been taking her prescribed vitamin D supplement due to a pharmacy issue, and she expresses concern that her low vitamin D levels could be contributing to her hair loss. She is eager to resume the supplement in hopes of improving her hair growth.  A1c in July was 9.8  All ROS negative with exception of what is listed above.   PHYSICAL EXAM Physical Exam Vitals and nursing note reviewed.  Constitutional:      Appearance: Normal appearance. She is obese.  HENT:     Head:   Eyes:     Conjunctiva/sclera: Conjunctivae normal.  Neck:     Vascular: No carotid bruit.  Cardiovascular:     Rate and Rhythm: Normal rate. Rhythm irregular.     Pulses: Normal pulses.     Heart sounds: Normal heart sounds.  Pulmonary:     Effort: Pulmonary effort is normal.     Breath sounds: Normal breath sounds.  Musculoskeletal:        General: Normal range of motion.     Cervical back: Neck supple.     Right lower leg: Edema present.     Left lower leg: Edema present.  Lymphadenopathy:     Cervical: No cervical adenopathy.  Skin:    General: Skin is warm and dry.     Capillary Refill: Capillary refill takes less than 2 seconds.  Neurological:     Mental Status: She is alert and oriented to person, place, and time.     Sensory:  Sensory deficit present.     Motor: No weakness.  Psychiatric:        Mood and Affect: Mood normal.      PLAN Problem List Items Addressed This Visit     Hypertension associated with diabetes (HCC)    Stopped amlodipine and losartan/HCTZ due to concerns about hair loss. Blood pressure 128/82 in the office today. Recent headaches resolved with taking medication are concerning for significant elevation occurring during period without medication. Discussed importance of blood pressure control in diabetes management and reduction of risk  of serious cardiovascular consequences. - Reassess blood pressure management in follow-up visit - Recommend taking medication at the same time each day - Restart one medication at a time and monitor for side effects for one week, if tolerated well, add other medication.        Relevant Medications   Cholecalciferol (VITAMIN D3) 1.25 MG (50000 UT) TABS   losartan-hydrochlorothiazide (HYZAAR) 100-25 MG tablet   metFORMIN (GLUCOPHAGE) 1000 MG tablet   amLODipine (NORVASC) 10 MG tablet   Other Relevant Orders   Sedimentation Rate (Completed)   ANA, IFA (with reflex) (Completed)   C-reactive protein (Completed)   Hemoglobin A1c (Completed)   CBC with Differential/Platelet (Completed)   Comprehensive metabolic panel (Completed)   VITAMIN D 25 Hydroxy (Vit-D Deficiency, Fractures) (Completed)   Vitamin B12 (Completed)   Iron, TIBC and Ferritin Panel (Completed)   Morbid obesity (HCC)    BMI 42.13 today, indicating some weight loss since last visit. Diet and exercise recommendations reviewed.       Relevant Medications   Cholecalciferol (VITAMIN D3) 1.25 MG (50000 UT) TABS   losartan-hydrochlorothiazide (HYZAAR) 100-25 MG tablet   metFORMIN (GLUCOPHAGE) 1000 MG tablet   Other Relevant Orders   Sedimentation Rate (Completed)   ANA, IFA (with reflex) (Completed)   C-reactive protein (Completed)   Hemoglobin A1c (Completed)   CBC with Differential/Platelet (Completed)   Comprehensive metabolic panel (Completed)   VITAMIN D 25 Hydroxy (Vit-D Deficiency, Fractures) (Completed)   Vitamin B12 (Completed)   Iron, TIBC and Ferritin Panel (Completed)   Smoker    Recommend smoking cessation for optimal health outcomes.       Relevant Medications   Cholecalciferol (VITAMIN D3) 1.25 MG (50000 UT) TABS   losartan-hydrochlorothiazide (HYZAAR) 100-25 MG tablet   metFORMIN (GLUCOPHAGE) 1000 MG tablet   Other Relevant Orders   Sedimentation Rate (Completed)   ANA, IFA (with reflex)  (Completed)   C-reactive protein (Completed)   Hemoglobin A1c (Completed)   CBC with Differential/Platelet (Completed)   Comprehensive metabolic panel (Completed)   VITAMIN D 25 Hydroxy (Vit-D Deficiency, Fractures) (Completed)   Vitamin B12 (Completed)   Iron, TIBC and Ferritin Panel (Completed)   Mixed hyperlipidemia due to type 2 diabetes mellitus (HCC)    Chronic elevation in lipids in the setting of DM, HTN, and obesity. Atorvastatin for management. No concerns reported.  -Continue atorvastatin for lipid control -LDL goal less than 70      Relevant Medications   Cholecalciferol (VITAMIN D3) 1.25 MG (50000 UT) TABS   losartan-hydrochlorothiazide (HYZAAR) 100-25 MG tablet   metFORMIN (GLUCOPHAGE) 1000 MG tablet   amLODipine (NORVASC) 10 MG tablet   Other Relevant Orders   Sedimentation Rate (Completed)   ANA, IFA (with reflex) (Completed)   C-reactive protein (Completed)   Hemoglobin A1c (Completed)   CBC with Differential/Platelet (Completed)   Comprehensive metabolic panel (Completed)   VITAMIN D 25 Hydroxy (Vit-D Deficiency, Fractures) (Completed)   Vitamin B12 (Completed)  Iron, TIBC and Ferritin Panel (Completed)   Type 2 diabetes mellitus with complications (HCC)    Three-month follow-up. Difficulty losing weight despite dietary changes and exercise. On Ozempic, previously on Trulicity and Victoza. Experiencing peripheral neuropathy with tingling and numbness in big toes. No vision changes or non-healing sores. Blood pressure 128/82 without medication for a week. Insurance required trial of daily medication before approving Ozempic. - Continue Ozempic - Discontinue liraglutide - Monitor blood glucose levels - Educate on medication adherence - Reassess in follow-up visit      Relevant Medications   Cholecalciferol (VITAMIN D3) 1.25 MG (50000 UT) TABS   losartan-hydrochlorothiazide (HYZAAR) 100-25 MG tablet   metFORMIN (GLUCOPHAGE) 1000 MG tablet   Other Relevant  Orders   Sedimentation Rate (Completed)   ANA, IFA (with reflex) (Completed)   C-reactive protein (Completed)   Hemoglobin A1c (Completed)   CBC with Differential/Platelet (Completed)   Comprehensive metabolic panel (Completed)   VITAMIN D 25 Hydroxy (Vit-D Deficiency, Fractures) (Completed)   Vitamin B12 (Completed)   Iron, TIBC and Ferritin Panel (Completed)   Irregular heart beat - Primary    Chronic in nature with no symptoms. Previous EKG showed PAC's present. Intermittent in nature, which is reassuring.  -Consider cardiology referral of these become more frequent.       Relevant Medications   Cholecalciferol (VITAMIN D3) 1.25 MG (50000 UT) TABS   losartan-hydrochlorothiazide (HYZAAR) 100-25 MG tablet   metFORMIN (GLUCOPHAGE) 1000 MG tablet   Other Relevant Orders   Sedimentation Rate (Completed)   ANA, IFA (with reflex) (Completed)   C-reactive protein (Completed)   Hemoglobin A1c (Completed)   CBC with Differential/Platelet (Completed)   Comprehensive metabolic panel (Completed)   VITAMIN D 25 Hydroxy (Vit-D Deficiency, Fractures) (Completed)   Vitamin B12 (Completed)   Iron, TIBC and Ferritin Panel (Completed)   Problem with, hearing, bilateral   Relevant Medications   Cholecalciferol (VITAMIN D3) 1.25 MG (50000 UT) TABS   losartan-hydrochlorothiazide (HYZAAR) 100-25 MG tablet   metFORMIN (GLUCOPHAGE) 1000 MG tablet   Other Relevant Orders   Sedimentation Rate (Completed)   ANA, IFA (with reflex) (Completed)   C-reactive protein (Completed)   Hemoglobin A1c (Completed)   CBC with Differential/Platelet (Completed)   Comprehensive metabolic panel (Completed)   VITAMIN D 25 Hydroxy (Vit-D Deficiency, Fractures) (Completed)   Vitamin B12 (Completed)   Iron, TIBC and Ferritin Panel (Completed)   Nonscarring hair loss, unspecified    Hair thinning primarily in the front. Discussed that this is unlikely blood pressure medications. She does have low vitamin D levels.  Considering atorvastatin as a potential cause. Discussed potential autoimmune causes like alopecia areata. We discussed holding atorvastatin for a week to see if she notices any hair growth beginning and if so, let me know.  - Hold atorvastatin - [facility] D [facility] - Check for autoimmune conditions (e.g., alopecia areata) - Monitor hair growth and reassess in follow-up visit      Relevant Medications   Cholecalciferol (VITAMIN D3) 1.25 MG (50000 UT) TABS   losartan-hydrochlorothiazide (HYZAAR) 100-25 MG tablet   metFORMIN (GLUCOPHAGE) 1000 MG tablet   Other Relevant Orders   Sedimentation Rate (Completed)   ANA, IFA (with reflex) (Completed)   C-reactive protein (Completed)   Hemoglobin A1c (Completed)   CBC with Differential/Platelet (Completed)   Comprehensive metabolic panel (Completed)   VITAMIN D 25 Hydroxy (Vit-D Deficiency, Fractures) (Completed)   Vitamin B12 (Completed)   Iron, TIBC and Ferritin Panel (Completed)   Vitamin D  deficiency   Relevant Medications   Cholecalciferol (VITAMIN D3) 1.25 MG (50000 UT) TABS   losartan-hydrochlorothiazide (HYZAAR) 100-25 MG tablet   metFORMIN (GLUCOPHAGE) 1000 MG tablet   Other Relevant Orders   Sedimentation Rate (Completed)   ANA, IFA (with reflex) (Completed)   C-reactive protein (Completed)   Hemoglobin A1c (Completed)   CBC with Differential/Platelet (Completed)   Comprehensive metabolic panel (Completed)   VITAMIN D 25 Hydroxy (Vit-D Deficiency, Fractures) (Completed)   Vitamin B12 (Completed)   Iron, TIBC and Ferritin Panel (Completed)   Other Visit Diagnoses     Primary hypertension       Relevant Medications   Cholecalciferol (VITAMIN D3) 1.25 MG (50000 UT) TABS   losartan-hydrochlorothiazide (HYZAAR) 100-25 MG tablet   metFORMIN (GLUCOPHAGE) 1000 MG tablet   amLODipine (NORVASC) 10 MG tablet   Other Relevant Orders   Sedimentation Rate (Completed)   ANA, IFA (with reflex) (Completed)    C-reactive protein (Completed)   Hemoglobin A1c (Completed)   CBC with Differential/Platelet (Completed)   Comprehensive metabolic panel (Completed)   VITAMIN D 25 Hydroxy (Vit-D Deficiency, Fractures) (Completed)   Vitamin B12 (Completed)   Iron, TIBC and Ferritin Panel (Completed)   Type 2 diabetes mellitus with hyperglycemia, with long-term current use of insulin (HCC)       Relevant Medications   Cholecalciferol (VITAMIN D3) 1.25 MG (50000 UT) TABS   losartan-hydrochlorothiazide (HYZAAR) 100-25 MG tablet   metFORMIN (GLUCOPHAGE) 1000 MG tablet   Other Relevant Orders   Sedimentation Rate (Completed)   ANA, IFA (with reflex) (Completed)   C-reactive protein (Completed)   Hemoglobin A1c (Completed)   CBC with Differential/Platelet (Completed)   Comprehensive metabolic panel (Completed)   VITAMIN D 25 Hydroxy (Vit-D Deficiency, Fractures) (Completed)   Vitamin B12 (Completed)   Iron, TIBC and Ferritin Panel (Completed)       Return in about 3 months (around 04/19/2023) for Med Management 30.  Jane Clamp, DNP, AGNP-c

## 2023-01-18 ENCOUNTER — Encounter: Payer: Self-pay | Admitting: Nurse Practitioner

## 2023-01-18 DIAGNOSIS — E1159 Type 2 diabetes mellitus with other circulatory complications: Secondary | ICD-10-CM

## 2023-01-18 DIAGNOSIS — E118 Type 2 diabetes mellitus with unspecified complications: Secondary | ICD-10-CM

## 2023-01-18 DIAGNOSIS — E1169 Type 2 diabetes mellitus with other specified complication: Secondary | ICD-10-CM

## 2023-01-20 LAB — CBC WITH DIFFERENTIAL/PLATELET
Basophils Absolute: 0 10*3/uL (ref 0.0–0.2)
Basos: 0 %
EOS (ABSOLUTE): 0.3 10*3/uL (ref 0.0–0.4)
Eos: 5 %
Hematocrit: 36.9 % (ref 34.0–46.6)
Hemoglobin: 11.9 g/dL (ref 11.1–15.9)
Immature Grans (Abs): 0 10*3/uL (ref 0.0–0.1)
Immature Granulocytes: 0 %
Lymphocytes Absolute: 2.4 10*3/uL (ref 0.7–3.1)
Lymphs: 34 %
MCH: 27.3 pg (ref 26.6–33.0)
MCHC: 32.2 g/dL (ref 31.5–35.7)
MCV: 85 fL (ref 79–97)
Monocytes Absolute: 0.5 10*3/uL (ref 0.1–0.9)
Monocytes: 7 %
Neutrophils Absolute: 3.8 10*3/uL (ref 1.4–7.0)
Neutrophils: 54 %
Platelets: 224 10*3/uL (ref 150–450)
RBC: 4.36 x10E6/uL (ref 3.77–5.28)
RDW: 13.9 % (ref 11.7–15.4)
WBC: 7.1 10*3/uL (ref 3.4–10.8)

## 2023-01-20 LAB — COMPREHENSIVE METABOLIC PANEL
ALT: 12 [IU]/L (ref 0–32)
AST: 12 IU/L (ref 0–40)
Albumin: 3.7 g/dL — ABNORMAL LOW (ref 3.9–4.9)
Alkaline Phosphatase: 92 [IU]/L (ref 44–121)
BUN/Creatinine Ratio: 15 (ref 12–28)
BUN: 12 mg/dL (ref 8–27)
Bilirubin Total: 0.2 mg/dL (ref 0.0–1.2)
CO2: 25 mmol/L (ref 20–29)
Calcium: 9 mg/dL (ref 8.7–10.3)
Chloride: 104 mmol/L (ref 96–106)
Creatinine, Ser: 0.8 mg/dL (ref 0.57–1.00)
Globulin, Total: 2.5 g/dL (ref 1.5–4.5)
Glucose: 250 mg/dL — ABNORMAL HIGH (ref 70–99)
Potassium: 4.8 mmol/L (ref 3.5–5.2)
Sodium: 139 mmol/L (ref 134–144)
Total Protein: 6.2 g/dL (ref 6.0–8.5)
eGFR: 84 mL/min/{1.73_m2} (ref >59)

## 2023-01-20 LAB — HEMOGLOBIN A1C
Est. average glucose Bld gHb Est-mCnc: 203 mg/dL
Hgb A1c MFr Bld: 8.7 % — ABNORMAL HIGH (ref 4.8–5.6)

## 2023-01-20 LAB — VITAMIN D 25 HYDROXY (VIT D DEFICIENCY, FRACTURES): Vit D, 25-Hydroxy: 18.6 ng/mL — ABNORMAL LOW (ref 30.0–100.0)

## 2023-01-20 LAB — SEDIMENTATION RATE: Sed Rate: 17 mm/h (ref 0–40)

## 2023-01-20 LAB — IRON,TIBC AND FERRITIN PANEL
Ferritin: 36 ng/mL (ref 15–150)
Iron Saturation: 14 % — ABNORMAL LOW (ref 15–55)
Iron: 45 ug/dL (ref 27–139)
Total Iron Binding Capacity: 316 ug/dL (ref 250–450)
UIBC: 271 ug/dL (ref 118–369)

## 2023-01-20 LAB — C-REACTIVE PROTEIN: CRP: 4 mg/L (ref 0–10)

## 2023-01-20 LAB — VITAMIN B12: Vitamin B-12: 617 pg/mL (ref 232–1245)

## 2023-01-20 LAB — ANTINUCLEAR ANTIBODIES, IFA: ANA Titer 1: NEGATIVE

## 2023-01-22 ENCOUNTER — Telehealth: Payer: Self-pay

## 2023-01-22 NOTE — Telephone Encounter (Signed)
Key: [id number] Drug: Ozempic (0.25 or 0.5 MG/DOSE) 2MG Ronny Bacon pen-injectors Form: Ambulance person PA Form (2017 NCP[place]) Determination: Wait for Determination

## 2023-01-22 NOTE — Telephone Encounter (Signed)
Pt notified via MyChart message. Approval faxed to pharmacy.

## 2023-01-22 NOTE — Telephone Encounter (Signed)
Key: [id number] Drug: Ozempic (0.25 or 0.5 MG/DOSE) 2MG Ronny Bacon pen-injectors Form: Ambulance person PA Form (2017 NCPDP) Determination: Favorable  Your PA request has been approved.   Authorization Expiration Date: January 22, 2024.

## 2023-01-24 DIAGNOSIS — L659 Nonscarring hair loss, unspecified: Secondary | ICD-10-CM | POA: Insufficient documentation

## 2023-01-24 DIAGNOSIS — H9193 Unspecified hearing loss, bilateral: Secondary | ICD-10-CM | POA: Insufficient documentation

## 2023-01-24 NOTE — Assessment & Plan Note (Signed)
Chronic in nature with no symptoms. Previous EKG showed PAC's present. Intermittent in nature, which is reassuring.  -Consider cardiology referral of these become more frequent.

## 2023-01-24 NOTE — Assessment & Plan Note (Signed)
Recommend smoking cessation for optimal health outcomes.

## 2023-01-24 NOTE — Assessment & Plan Note (Signed)
BMI 42.13 today, indicating some weight loss since last visit. Diet and exercise recommendations reviewed.

## 2023-01-24 NOTE — Assessment & Plan Note (Signed)
Chronic elevation in lipids in the setting of DM, HTN, and obesity. Atorvastatin for management. No concerns reported.  -Continue atorvastatin for lipid control -LDL goal less than 70

## 2023-01-24 NOTE — Assessment & Plan Note (Signed)
Three-month follow-up. Difficulty losing weight despite dietary changes and exercise. On Ozempic, previously on Trulicity and Victoza. Experiencing peripheral neuropathy with tingling and numbness in big toes. No vision changes or non-healing sores. Blood pressure 128/82 without medication for a week. Insurance required trial of daily medication before approving Ozempic. - Continue Ozempic - Discontinue liraglutide - Monitor blood glucose levels - Educate on medication adherence - Reassess in follow-up visit

## 2023-01-24 NOTE — Assessment & Plan Note (Signed)
Stopped amlodipine and losartan/HCTZ due to concerns about hair loss. Blood pressure 128/82 in the office today. Recent headaches resolved with taking medication are concerning for significant elevation occurring during period without medication. Discussed importance of blood pressure control in diabetes management and reduction of risk of serious cardiovascular consequences. - Reassess blood pressure management in follow-up visit - Recommend taking medication at the same time each day - Restart one medication at a time and monitor for side effects for one week, if tolerated well, add other medication.

## 2023-01-24 NOTE — Assessment & Plan Note (Signed)
Hair thinning primarily in the front. Discussed that this is unlikely blood pressure medications. She does have low vitamin D levels. Considering atorvastatin as a potential cause. Discussed potential autoimmune causes like alopecia areata. We discussed holding atorvastatin for a week to see if she notices any hair growth beginning and if so, let me know.  - Hold atorvastatin - [facility] - Check for autoimmune conditions (e.g., alopecia areata) - Monitor hair growth and reassess in follow-up visit

## 2023-01-29 ENCOUNTER — Telehealth: Payer: Self-pay | Admitting: Nurse Practitioner

## 2023-01-29 MED ORDER — IRON (FERROUS SULFATE) 325 (65 FE) MG PO TABS: ORAL_TABLET | ORAL | 3 refills | Status: DC

## 2023-01-29 MED ORDER — VITAMIN D (ERGOCALCIFEROL) 1.25 MG (50000 UNIT) PO CAPS: ORAL_CAPSULE | ORAL | 3 refills | Status: DC

## 2023-01-29 NOTE — Addendum Note (Signed)
Addended by: Ravonda Brecheen, Huntley Dec E on: 01/29/2023 08:25 AM   Modules accepted: Orders

## 2023-01-29 NOTE — Telephone Encounter (Signed)
Called Ins company [facility] to see why Ozempic cost is $942. Held for 33 minutes & issue is pt has $7500 deductible which includes medical & pharmacy.  They couldn't tell me how much is left for this year.  Checked on discount card & will only save $150 a month so cost would still be $800 a month until deductible is met.  Novo Nordisk Pt Assistance will not work with Nurse, learning disability.  I suggested pt meet with [employer] for possibly changing to a different plan that has a lower deductible & still affordable for her.  In the meantime, options are to put her back on Trulicity & Victoza she was able to get those for $25 a month but A1c still not good but right now she is out.  Also we do have samples of Rybelsus 3mg  that we keep regularly if you want to try her on that?

## 2023-02-01 NOTE — Telephone Encounter (Signed)
Called Ins company [facility] to see why Ozempic cost is $942. Held for 33 minutes & issue is pt has $7500 deductible which includes medical & pharmacy. They couldn't tell me how much is left for this year. Checked on discount card & will only save $150 a month so cost would still be $800 a month until deductible is met. Novo Nordisk Pt Assistance will not work with Nurse, learning disability. I suggested pt meet with [employer] for possibly changing to a different plan that has a lower deductible & still affordable for her. In the meantime, options are to put her back on Trulicity & Victoza she was able to get those for $25 a month but A1c still not good but right now she is out. Also we do have samples of Rybelsus 3mg  that we keep lots of, so sent Huntley Dec a message to see if she wants to add that in the meantime. (Adam if you will please follow up with SaraBeth as pt is out of meds)

## 2023-02-05 ENCOUNTER — Other Ambulatory Visit: Payer: Self-pay | Admitting: Nurse Practitioner

## 2023-02-05 DIAGNOSIS — E1169 Type 2 diabetes mellitus with other specified complication: Secondary | ICD-10-CM

## 2023-02-05 DIAGNOSIS — E118 Type 2 diabetes mellitus with unspecified complications: Secondary | ICD-10-CM

## 2023-02-05 DIAGNOSIS — E1159 Type 2 diabetes mellitus with other circulatory complications: Secondary | ICD-10-CM

## 2023-02-05 MED ORDER — TRULICITY 1.5 MG/0.5ML ~~LOC~~ SOAJ: 1.5000 mg | SUBCUTANEOUS | 0 refills | Status: DC

## 2023-02-05 MED ORDER — TRULICITY 3 MG/0.5ML ~~LOC~~ SOAJ: 3.0000 mg | SUBCUTANEOUS | 0 refills | Status: DC

## 2023-02-05 MED ORDER — TRULICITY 4.5 MG/0.5ML ~~LOC~~ SOAJ: 4.5000 mg | SUBCUTANEOUS | 6 refills | Status: DC

## 2023-03-03 MED ORDER — TRULICITY 1.5 MG/0.5ML ~~LOC~~ SOAJ: 1.5000 mg | SUBCUTANEOUS | 0 refills | Status: DC

## 2023-03-03 MED ORDER — TRULICITY 4.5 MG/0.5ML ~~LOC~~ SOAJ: 4.5000 mg | SUBCUTANEOUS | 0 refills | Status: DC

## 2023-03-03 MED ORDER — TRULICITY 3 MG/0.5ML ~~LOC~~ SOAJ: 3.0000 mg | SUBCUTANEOUS | 0 refills | Status: DC

## 2023-03-21 ENCOUNTER — Other Ambulatory Visit: Payer: Self-pay | Admitting: Nurse Practitioner

## 2023-03-21 DIAGNOSIS — I10 Essential (primary) hypertension: Secondary | ICD-10-CM

## 2023-03-21 DIAGNOSIS — E1159 Type 2 diabetes mellitus with other circulatory complications: Secondary | ICD-10-CM

## 2023-03-28 ENCOUNTER — Encounter: Payer: 59 | Admitting: Nurse Practitioner

## 2023-04-14 NOTE — Progress Notes (Deleted)
 Vaccines: shingles/covid/t-dap/pneumonia Last PAP: Last colonoscopy: Last eye exam:   Catheline Doing, DNP, AGNP-c [facility]  [address] [place], [place] [address] [phone]  ESTABLISHED PATIENT- Chronic Health and/or Follow-Up Visit  There were no vitals taken for this visit.    Jane Lopez is a 63 y.o. year old female presenting today for evaluation and management of chronic conditions.   DM, HTN, HLD in the setting of E66.01.  Still smoking? Last labs: 01/2023 : A1c 8.7, iron  deficiency, vit d deficiency  Outpatient Encounter Medications as of 04/17/2023  Medication Sig   amLODipine  (NORVASC ) 10 MG tablet TAKE 1 TABLET BY MOUTH EVERY DAY   atorvastatin  (LIPITOR) 20 MG tablet Take 1 tablet (20 mg total) by mouth daily.   Dulaglutide  (TRULICITY ) 1.5 MG/0.5ML SOAJ Inject 1.5 mg into the skin once a week. Month 1   Dulaglutide  (TRULICITY ) 3 MG/0.5ML SOAJ Inject 3 mg as directed once a week. Month 2   Dulaglutide  (TRULICITY ) 4.5 MG/0.5ML SOAJ Inject 4.5 mg as directed once a week. Month 3   Insulin  Pen Needle 32G X 4 MM MISC Use with insulin  medication   Iron , Ferrous Sulfate , 325 (65 Fe) MG TABS Take one tablet twice a week for low iron .   losartan -hydrochlorothiazide (HYZAAR) 100-25 MG tablet Take 1 tablet by mouth daily.   metFORMIN  (GLUCOPHAGE ) 1000 MG tablet TAKE 1 TABLET DAILY WITH BREAKFAST.   Vitamin D , Ergocalciferol , (DRISDOL ) 1.25 MG (50000 UNIT) CAPS capsule Take one capsule by mouth twice a week for vitamin D  deficiency.   No facility-administered encounter medications on file as of 04/17/2023.     All ROS negative with exception of what is listed above.   PHYSICAL EXAM Physical Exam   PLAN Problem List Items Addressed This Visit   None   No follow-ups on file.  Catheline Doing, DNP, AGNP-c

## 2023-04-17 ENCOUNTER — Encounter: Payer: 59 | Admitting: Nurse Practitioner

## 2023-04-20 NOTE — Telephone Encounter (Signed)
Pt on trulicity

## 2023-04-21 ENCOUNTER — Telehealth: Payer: Self-pay | Admitting: Internal Medicine

## 2023-04-21 NOTE — Telephone Encounter (Signed)
 Patient was identified as falling into the [place] Measure - Diabetes.   Patient was: Appointment scheduled with primary care provider in the next 30 days.

## 2023-04-28 ENCOUNTER — Ambulatory Visit: Payer: 59 | Admitting: Nurse Practitioner

## 2023-04-28 ENCOUNTER — Encounter: Payer: Self-pay | Admitting: Nurse Practitioner

## 2023-04-28 VITALS — BP 126/80 | HR 60 | Wt 265.6 lb

## 2023-04-28 DIAGNOSIS — E1165 Type 2 diabetes mellitus with hyperglycemia: Secondary | ICD-10-CM

## 2023-04-28 DIAGNOSIS — I152 Hypertension secondary to endocrine disorders: Secondary | ICD-10-CM

## 2023-04-28 DIAGNOSIS — E1159 Type 2 diabetes mellitus with other circulatory complications: Secondary | ICD-10-CM | POA: Diagnosis not present

## 2023-04-28 DIAGNOSIS — F17200 Nicotine dependence, unspecified, uncomplicated: Secondary | ICD-10-CM

## 2023-04-28 DIAGNOSIS — R200 Anesthesia of skin: Secondary | ICD-10-CM | POA: Insufficient documentation

## 2023-04-28 DIAGNOSIS — E1169 Type 2 diabetes mellitus with other specified complication: Secondary | ICD-10-CM

## 2023-04-28 DIAGNOSIS — B379 Candidiasis, unspecified: Secondary | ICD-10-CM

## 2023-04-28 DIAGNOSIS — E118 Type 2 diabetes mellitus with unspecified complications: Secondary | ICD-10-CM

## 2023-04-28 DIAGNOSIS — E782 Mixed hyperlipidemia: Secondary | ICD-10-CM

## 2023-04-28 DIAGNOSIS — F4321 Adjustment disorder with depressed mood: Secondary | ICD-10-CM

## 2023-04-28 DIAGNOSIS — Z794 Long term (current) use of insulin: Secondary | ICD-10-CM

## 2023-04-28 DIAGNOSIS — L659 Nonscarring hair loss, unspecified: Secondary | ICD-10-CM

## 2023-04-28 DIAGNOSIS — E559 Vitamin D deficiency, unspecified: Secondary | ICD-10-CM

## 2023-04-28 MED ORDER — AMLODIPINE BESYLATE 10 MG PO TABS: 10.0000 mg | ORAL_TABLET | Freq: Every day | ORAL | 3 refills | Status: AC

## 2023-04-28 MED ORDER — METFORMIN HCL 1000 MG PO TABS: ORAL_TABLET | ORAL | 3 refills | Status: AC

## 2023-04-28 MED ORDER — LOSARTAN POTASSIUM-HCTZ 100-25 MG PO TABS: 1.0000 | ORAL_TABLET | Freq: Every day | ORAL | 3 refills | Status: AC

## 2023-04-28 MED ORDER — FLUCONAZOLE 150 MG PO TABS: ORAL_TABLET | ORAL | 2 refills | Status: DC

## 2023-04-28 MED ORDER — ATORVASTATIN CALCIUM 20 MG PO TABS: 20.0000 mg | ORAL_TABLET | Freq: Every day | ORAL | 3 refills | Status: DC

## 2023-04-28 MED ORDER — TRULICITY 3 MG/0.5ML ~~LOC~~ SOAJ: 3.0000 mg | SUBCUTANEOUS | 0 refills | Status: DC

## 2023-04-28 MED ORDER — ESCITALOPRAM OXALATE 10 MG PO TABS: 10.0000 mg | ORAL_TABLET | Freq: Every day | ORAL | 1 refills | Status: DC

## 2023-04-28 MED ORDER — TRULICITY 4.5 MG/0.5ML ~~LOC~~ SOAJ: 4.5000 mg | SUBCUTANEOUS | 0 refills | Status: DC

## 2023-04-28 NOTE — Assessment & Plan Note (Signed)
 Patient is on Trulicity and metformin for diabetes management. Emphasized the importance of a low carbohydrate diet and monitoring blood sugar levels. Patient's weight is stable but she desires weight loss. Iron and vitamin D supplements have improved her energy levels. - Continue Trulicity, increase to 3 mg this month and 4.5 mg next month - Continue metformin with breakfast - Provide dietary guidance to reduce carbohydrate intake, especially fruits - Check A1c levels - Continue iron and vitamin D supplements

## 2023-04-28 NOTE — Assessment & Plan Note (Signed)
 Improved with cessation of atorvastatin. Will monitor.

## 2023-04-28 NOTE — Assessment & Plan Note (Signed)
 Unable to tolerate atorvastatin due to hair loss. Dietary measures will need to help keep lipids less than 70.

## 2023-04-28 NOTE — Progress Notes (Signed)
 [name], DNP, AGNP-c [facility]  [address] [place], [place] [address] [phone]  ESTABLISHED PATIENT- Chronic Health and/or Follow-Up Visit  Blood pressure 126/80, pulse 60, weight 265 lb 9.6 oz (120.5 kg).    Jane Lopez is a 63 y.o. year old female presenting today for evaluation and management of chronic conditions.   History of Present Illness Jane Lopez is a 63 year old female with diabetes who presents for a follow-up visit.  She experiences worsening numbness in her big toes, which is exacerbated after standing all day at work. The numbness is accompanied by a coldness in her toes, though not in the back of her foot. The numbness improves after a good night's sleep.  She has diabetes and is on Trulicity, possibly at 1.5 mg, and metformin with breakfast. She has not checked her blood sugars recently but is concerned about her A1c levels. She follows a low carbohydrate diet, focusing on protein and fruits, she reports she has been consuming more fruit lately.  She recently had a yeast infection, which she attributes to antibiotics taken for oral surgery. Over-the-counter treatments have been ineffective, and she associates yeast infections with high blood sugar levels, though she has not experienced increased urination.  She is not taking atorvastatin due to hair loss concerns and has noticed hair regrowth since stopping it. She takes losartan hydrochlorothiazide for blood pressure but reports pharmacy issues with refills. She also takes iron and vitamin D supplements, which have improved her energy and leg pain.  She feels tired and possibly depressed, especially after her mother's recent death. She has a therapist but has not been on medication for depression before.   All ROS negative with exception of what is listed above.   PHYSICAL EXAM Physical Exam Vitals and nursing note reviewed.  Constitutional:      Appearance: Normal  appearance.  HENT:     Head: Normocephalic.  Eyes:     Pupils: Pupils are equal, round, and reactive to light.  Cardiovascular:     Rate and Rhythm: Normal rate and regular rhythm.     Pulses: Normal pulses.     Heart sounds: Normal heart sounds.  Pulmonary:     Effort: Pulmonary effort is normal.     Breath sounds: Normal breath sounds.  Musculoskeletal:        General: Normal range of motion.     Cervical back: Normal range of motion.     Right lower leg: No edema.     Left lower leg: No edema.     Right foot: Normal.     Left foot: Normal.  Skin:    General: Skin is warm.  Neurological:     General: No focal deficit present.     Mental Status: She is alert and oriented to person, place, and time.  Psychiatric:        Mood and Affect: Mood normal.      PLAN Problem List Items Addressed This Visit     Hypertension associated with diabetes (HCC)   Patient is on losartan hydrochlorothiazide. Addressed medication refill issue and ensured a 90-day supply with refills. - Ensure losartan hydrochlorothiazide prescription is filled with 90-day supply and refills      Relevant Medications   fluconazole (DIFLUCAN) 150 MG tablet   amLODipine (NORVASC) 10 MG tablet   losartan-hydrochlorothiazide (HYZAAR) 100-25 MG tablet   metFORMIN (GLUCOPHAGE) 1000 MG tablet   Dulaglutide (TRULICITY) 3 MG/0.5ML SOAJ   Dulaglutide (TRULICITY) 4.5 MG/0.5ML  SOAJ (Start on 05/26/2023)   Other Relevant Orders   Hemoglobin A1c   CBC with Differential/Platelet   Comprehensive metabolic panel   VITAMIN D 25 Hydroxy (Vit-D Deficiency, Fractures)   LP+LDL Direct   Mixed hyperlipidemia due to type 2 diabetes mellitus (HCC)   Unable to tolerate atorvastatin due to hair loss. Dietary measures will need to help keep lipids less than 70.      Relevant Medications   fluconazole (DIFLUCAN) 150 MG tablet   amLODipine (NORVASC) 10 MG tablet   losartan-hydrochlorothiazide (HYZAAR) 100-25 MG tablet    metFORMIN (GLUCOPHAGE) 1000 MG tablet   Dulaglutide (TRULICITY) 3 MG/0.5ML SOAJ   Dulaglutide (TRULICITY) 4.5 MG/0.5ML SOAJ (Start on 05/26/2023)   Other Relevant Orders   Hemoglobin A1c   CBC with Differential/Platelet   Comprehensive metabolic panel   VITAMIN D 25 Hydroxy (Vit-D Deficiency, Fractures)   LP+LDL Direct   Type 2 diabetes mellitus with complications (HCC) - Primary   Patient is on Trulicity and metformin for diabetes management. Emphasized the importance of a low carbohydrate diet and monitoring blood sugar levels. Patient's weight is stable but she desires weight loss. Iron and vitamin D supplements have improved her energy levels. - Continue Trulicity, increase to 3 mg this month and 4.5 mg next month - Continue metformin with breakfast - Provide dietary guidance to reduce carbohydrate intake, especially fruits - Check A1c levels - Continue iron and vitamin D supplements      Relevant Medications   fluconazole (DIFLUCAN) 150 MG tablet   losartan-hydrochlorothiazide (HYZAAR) 100-25 MG tablet   metFORMIN (GLUCOPHAGE) 1000 MG tablet   Dulaglutide (TRULICITY) 3 MG/0.5ML SOAJ   Dulaglutide (TRULICITY) 4.5 MG/0.5ML SOAJ (Start on 05/26/2023)   Other Relevant Orders   Hemoglobin A1c   CBC with Differential/Platelet   Comprehensive metabolic panel   VITAMIN D 25 Hydroxy (Vit-D Deficiency, Fractures)   LP+LDL Direct   Nonscarring hair loss, unspecified   Improved with cessation of atorvastatin. Will monitor.       Relevant Medications   losartan-hydrochlorothiazide (HYZAAR) 100-25 MG tablet   metFORMIN (GLUCOPHAGE) 1000 MG tablet   Candida infection   Diflucan provided for relief.       Relevant Medications   fluconazole (DIFLUCAN) 150 MG tablet   Grief reaction   Patient reports fatigue, low energy, and depression, especially after her mother's loss. She has a therapist but is not on medication. Discussed escitalopram, including potential benefits and side effects  such as initial sleepiness and onset of effect in about a week. - Prescribe escitalopram, to be taken at bedtime - Monitor patient's response to medication      Relevant Medications   escitalopram (LEXAPRO) 10 MG tablet   Numbness of toes   Worsening numbness in big toes, likely due to diabetic neuropathy, but also consider leg fatigue and possible inflammation after long work day as a culprit. Discussed the impact of high blood sugar on nerve damage and the importance of reducing carbohydrate intake, especially fruits. No obvious change present today.  - Monitor blood sugar levels - Continue Trulicity, increase to 3 mg this month and 4.5 mg next month - Continue metformin with breakfast - Provide dietary guidance to reduce carbohydrate intake, especially fruits - Consider referral to orthopedics if symptoms persist      Morbid obesity (HCC)   Relevant Medications   fluconazole (DIFLUCAN) 150 MG tablet   losartan-hydrochlorothiazide (HYZAAR) 100-25 MG tablet   metFORMIN (GLUCOPHAGE) 1000 MG tablet   Dulaglutide (TRULICITY) 3 MG/0.5ML  SOAJ   Dulaglutide (TRULICITY) 4.5 MG/0.5ML SOAJ (Start on 05/26/2023)   Other Relevant Orders   Hemoglobin A1c   CBC with Differential/Platelet   Comprehensive metabolic panel   VITAMIN D 25 Hydroxy (Vit-D Deficiency, Fractures)   LP+LDL Direct   Smoker   Relevant Medications   fluconazole (DIFLUCAN) 150 MG tablet   losartan-hydrochlorothiazide (HYZAAR) 100-25 MG tablet   metFORMIN (GLUCOPHAGE) 1000 MG tablet   Other Relevant Orders   Hemoglobin A1c   CBC with Differential/Platelet   Comprehensive metabolic panel   VITAMIN D 25 Hydroxy (Vit-D Deficiency, Fractures)   LP+LDL Direct   Vitamin D deficiency   Relevant Medications   fluconazole (DIFLUCAN) 150 MG tablet   losartan-hydrochlorothiazide (HYZAAR) 100-25 MG tablet   metFORMIN (GLUCOPHAGE) 1000 MG tablet   Other Relevant Orders   Hemoglobin A1c   CBC with Differential/Platelet    Comprehensive metabolic panel   VITAMIN D 25 Hydroxy (Vit-D Deficiency, Fractures)   LP+LDL Direct   Other Visit Diagnoses       Type 2 diabetes mellitus with hyperglycemia, with long-term current use of insulin (HCC)       Relevant Medications   losartan-hydrochlorothiazide (HYZAAR) 100-25 MG tablet   metFORMIN (GLUCOPHAGE) 1000 MG tablet   Dulaglutide (TRULICITY) 3 MG/0.5ML SOAJ   Dulaglutide (TRULICITY) 4.5 MG/0.5ML SOAJ (Start on 05/26/2023)       Return in about 2 months (around 06/26/2023) for Med Management 30-mood virtual ok.  [name], DNP, AGNP-c

## 2023-04-28 NOTE — Assessment & Plan Note (Signed)
 Patient reports fatigue, low energy, and depression, especially after her mother's loss. She has a therapist but is not on medication. Discussed escitalopram, including potential benefits and side effects such as initial sleepiness and onset of effect in about a week. - Prescribe escitalopram, to be taken at bedtime - Monitor patient's response to medication

## 2023-04-28 NOTE — Assessment & Plan Note (Signed)
 Worsening numbness in big toes, likely due to diabetic neuropathy, but also consider leg fatigue and possible inflammation after long work day as a culprit. Discussed the impact of high blood sugar on nerve damage and the importance of reducing carbohydrate intake, especially fruits. No obvious change present today.  - Monitor blood sugar levels - Continue Trulicity, increase to 3 mg this month and 4.5 mg next month - Continue metformin with breakfast - Provide dietary guidance to reduce carbohydrate intake, especially fruits - Consider referral to orthopedics if symptoms persist

## 2023-04-28 NOTE — Assessment & Plan Note (Signed)
 Patient is on losartan hydrochlorothiazide. Addressed medication refill issue and ensured a 90-day supply with refills. - Ensure losartan hydrochlorothiazide prescription is filled with 90-day supply and refills

## 2023-04-28 NOTE — Patient Instructions (Addendum)
 I sent in the escitalopram to start for mood. This usually takes about 1-2 weeks to start to notice a difference.  Managing Loss, Adult People experience loss in many different ways throughout their lives. Events such as moving, changing jobs, and losing friends can create a sense of loss. The loss may be as serious as a major health change, divorce, death of a pet, or death of a loved one. All of these types of loss are likely to create a physical and emotional reaction known as grief. Grief is the result of a major change or an absence of something or someone that you count on. Grief is a normal reaction to loss. A variety of factors can affect your grieving experience, including: The nature of your loss. Your relationship to what or whom you lost. Your understanding of grief and how to manage it. Your support system. Be aware that when grief becomes extreme, it can lead to more severe issues like isolation, depression, anxiety, or suicidal thoughts. Talk with your health care provider if you have any of these issues. How to manage lifestyle changes Keep to your normal routine as much as possible. If you have trouble focusing or doing normal activities, it is acceptable to take some time away from your normal routine. Spend time with friends and loved ones. Eat a healthy diet, get plenty of sleep, and rest when you feel tired. How to recognize changes  The way that you deal with your grief will affect your ability to function as you normally do. When grieving, you may experience these changes: Numbness, shock, sadness, anxiety, anger, denial, and guilt. Thoughts about death. Unexpected crying. A physical sensation of emptiness in your stomach. Problems sleeping and eating. Tiredness (fatigue). Loss of interest in normal activities. Dreaming about or imagining seeing the person who died. A need to remember what or whom you lost. Difficulty thinking about anything other than your loss for a  period of time. Relief. If you have been expecting the loss for a while, you may feel a sense of relief when it happens. Follow these instructions at home: Activity Express your feelings in healthy ways, such as: Talking with others about your loss. It may be helpful to find others who have had a similar loss, such as a support group. Writing down your feelings in a journal. Doing physical activities to release stress and emotional energy. Doing creative activities like painting, sculpting, or playing or listening to music. Practicing resilience. This is the ability to recover and adjust after facing challenges. Reading some resources that encourage resilience may help you to learn ways to practice those behaviors.  General instructions Be patient with yourself and others. Allow the grieving process to happen, and remember that grieving takes time. It is likely that you may never feel completely done with some grief. You may find a way to move on while still cherishing memories and feelings about your loss. Accepting your loss is a process. It can take months or longer to adjust. Keep all follow-up visits. This is important. Where to find support To get support for managing loss: Ask your health care provider for help and recommendations, such as grief counseling or therapy. Think about joining a support group for people who are managing a loss. Where to find more information You can find more information about managing loss from: American Society of Clinical Oncology: www.cancer.net American Psychological Association: [employer] Contact a health care provider if: Your grief is extreme and keeps getting worse.  You have ongoing grief that does not improve. Your body shows symptoms of grief, such as illness. You feel depressed, anxious, or hopeless. Get help right away if: You have thoughts about hurting yourself or others. Get help right away if you feel like you may hurt yourself or  others, or have thoughts about taking your own life. Go to your nearest emergency room or: Call 911. Call the National Suicide Prevention Lifeline at (940)763-7716 or 988. This is open 24 hours a day. Text the Crisis Text Line at (249) 768-1691. Summary Grief is the result of a major change or an absence of someone or something that you count on. Grief is a normal reaction to loss. The depth of grief and the period of recovery depend on the type of loss and your ability to adjust to the change and process your feelings. Processing grief requires patience and a willingness to accept your feelings and talk about your loss with people who are supportive. It is important to find resources that work for you and to realize that people experience grief differently. There is not one grieving process that works for everyone in the same way. Be aware that when grief becomes extreme, it can lead to more severe issues like isolation, depression, anxiety, or suicidal thoughts. Talk with your health care provider if you have any of these issues. This information is not intended to replace advice given to you by your health care provider. Make sure you discuss any questions you have with your health care provider. Document Revised: 10/16/2020 Document Reviewed: 10/16/2020 Elsevier Patient Education  2024 [employer].

## 2023-04-28 NOTE — Assessment & Plan Note (Signed)
 Diflucan provided for relief.

## 2023-04-29 LAB — COMPREHENSIVE METABOLIC PANEL
ALT: 15 [IU]/L (ref 0–32)
AST: 16 [IU]/L (ref 0–40)
Albumin: 4.1 g/dL (ref 3.9–4.9)
Alkaline Phosphatase: 89 [IU]/L (ref 44–121)
BUN/Creatinine Ratio: 13 (ref 12–28)
BUN: 13 mg/dL (ref 8–27)
Bilirubin Total: <0.2 mg/dL (ref 0.0–1.2)
CO2: 25 mmol/L (ref 20–29)
Calcium: 9.1 mg/dL (ref 8.7–10.3)
Chloride: 105 mmol/L (ref 96–106)
Creatinine, Ser: 1.02 mg/dL — ABNORMAL HIGH (ref 0.57–1.00)
Globulin, Total: 2.6 g/dL (ref 1.5–4.5)
Glucose: 109 mg/dL — ABNORMAL HIGH (ref 70–99)
Potassium: 4.5 mmol/L (ref 3.5–5.2)
Sodium: 141 mmol/L (ref 134–144)
Total Protein: 6.7 g/dL (ref 6.0–8.5)
eGFR: 62 mL/min/{1.73_m2} (ref >59)

## 2023-04-29 LAB — CBC WITH DIFFERENTIAL/PLATELET
Basophils Absolute: 0 10*3/uL (ref 0.0–0.2)
Basos: 1 %
EOS (ABSOLUTE): 0.2 10*3/uL (ref 0.0–0.4)
Eos: 4 %
Hematocrit: 39.7 % (ref 34.0–46.6)
Hemoglobin: 13.3 g/dL (ref 11.1–15.9)
Immature Grans (Abs): 0 10*3/uL (ref 0.0–0.1)
Immature Granulocytes: 0 %
Lymphocytes Absolute: 2.1 10*3/uL (ref 0.7–3.1)
Lymphs: 34 %
MCH: 27.7 pg (ref 26.6–33.0)
MCHC: 33.5 g/dL (ref 31.5–35.7)
MCV: 83 fL (ref 79–97)
Monocytes Absolute: 0.8 10*3/uL (ref 0.1–0.9)
Monocytes: 14 %
Neutrophils Absolute: 3 10*3/uL (ref 1.4–7.0)
Neutrophils: 47 %
Platelets: 246 10*3/uL (ref 150–450)
RBC: 4.8 x10E6/uL (ref 3.77–5.28)
RDW: 14.7 % (ref 11.7–15.4)
WBC: 6.2 10*3/uL (ref 3.4–10.8)

## 2023-04-29 LAB — VITAMIN D 25 HYDROXY (VIT D DEFICIENCY, FRACTURES): Vit D, 25-Hydroxy: 73.6 ng/mL (ref 30.0–100.0)

## 2023-04-29 LAB — HEMOGLOBIN A1C
Est. average glucose Bld gHb Est-mCnc: 206 mg/dL
Hgb A1c MFr Bld: 8.8 % — ABNORMAL HIGH (ref 4.8–5.6)

## 2023-04-29 LAB — LP+LDL DIRECT
Cholesterol, Total: 156 mg/dL (ref 100–199)
HDL: 40 mg/dL (ref >39)
LDL Chol Calc (NIH): 101 mg/dL — ABNORMAL HIGH (ref 0–99)
LDL Direct: 107 mg/dL — ABNORMAL HIGH (ref 0–99)
Triglycerides: 80 mg/dL (ref 0–149)
VLDL Cholesterol Cal: 15 mg/dL (ref 5–40)

## 2023-04-30 ENCOUNTER — Other Ambulatory Visit (HOSPITAL_COMMUNITY): Payer: Self-pay

## 2023-04-30 ENCOUNTER — Telehealth: Payer: Self-pay

## 2023-04-30 NOTE — Telephone Encounter (Signed)
 Pharmacy Patient Advocate Encounter   Received notification from CoverMyMeds that prior authorization for Trulicity 3MG /0.5ML auto-injectors is required/requested.   Insurance verification completed.   The patient is insured through [employer] .   Per test claim: PA required; PA submitted to above mentioned insurance via CoverMyMeds Key/confirmation #/EOC Key: [id number]    Status is pending

## 2023-05-02 ENCOUNTER — Other Ambulatory Visit (HOSPITAL_COMMUNITY): Payer: Self-pay

## 2023-05-02 ENCOUNTER — Encounter: Payer: Self-pay | Admitting: Nurse Practitioner

## 2023-05-02 ENCOUNTER — Encounter: Payer: 59 | Admitting: Nurse Practitioner

## 2023-05-02 NOTE — Telephone Encounter (Signed)
 Pharmacy Patient Advocate Encounter  Received notification from [employer] that Prior Authorization for Trulicity 3MG /0.5ML auto-injectors has been APPROVED from 2.19.25 to 2.20.26. Ran test claim, Copay is $RTS,Rx has been filled as of today 05/02/23. This test claim was processed through [facility]- copay amounts may vary at other pharmacies due to pharmacy/plan contracts, or as the patient moves through the different stages of their insurance plan.   PA #/Case ID/Reference #: (Key: [id number])

## 2023-05-23 ENCOUNTER — Telehealth: Payer: Self-pay | Admitting: *Deleted

## 2023-05-23 NOTE — Telephone Encounter (Signed)
..  Patient was identified as falling into the [place] Measure - Diabetes.   Patient was: Appointment scheduled with primary care provider in the next 30 days.   Patient has appt scheduled for 06/26/23.

## 2023-05-29 ENCOUNTER — Other Ambulatory Visit

## 2023-05-29 DIAGNOSIS — E118 Type 2 diabetes mellitus with unspecified complications: Secondary | ICD-10-CM

## 2023-05-29 MED ORDER — ONETOUCH VERIO FLEX SYSTEM W/DEVICE KIT: 1.0000 | PACK | Freq: Every day | 0 refills | Status: AC

## 2023-05-29 MED ORDER — ONETOUCH VERIO VI STRP: ORAL_STRIP | 3 refills | Status: AC

## 2023-05-29 MED ORDER — ONETOUCH DELICA LANCETS 33G MISC: 100.0000 | Freq: Every day | 3 refills | Status: AC

## 2023-05-29 NOTE — Progress Notes (Signed)
 05/29/2023 Name: Jane Lopez MRN: [id number] DOB: December 16, 1960  Chief Complaint  Patient presents with   Diabetes   Medication Management    Jane Lopez is a 63 y.o. year old female who presented for a telephone visit.   They were referred to the pharmacist for assistance in managing diabetes (TNM)   Subjective:  Care Team: Primary Care Provider: Tollie Eth, NP ; Next Scheduled Visit: 06/26/23  Medication Access/Adherence  Current Pharmacy:  [id number] [place], [place] - [address] [facility] [address] [place] [address] Phone: [phone] Fax: [phone]   Patient reports affordability concerns with their medications: Yes  - States trulicity 4.5mg  is requiring prior auth, confirmed this is the case Patient reports access/transportation concerns to their pharmacy: No  Patient reports adherence concerns with their medications:  No     Diabetes:  Current medications: Trulicity 3mg  on Sundays (has one dose left for 06/01/23, should go up to 4.5mg  the following week), Metformin 1000mg  Medications tried in the past: Toujeo, Ozempic, Victoza  Current glucose readings: None - reports meter broke and she has been unable to check  Patient denies hypoglycemic s/sx including dizziness, shakiness, sweating. Patient denies hyperglycemic symptoms including polyuria, polydipsia, polyphagia, nocturia, neuropathy, blurred vision.  Current medication access support: None   Objective:  Lab Results  Component Value Date   HGBA1C 8.8 (H) 04/28/2023    Lab Results  Component Value Date   CREATININE 1.02 (H) 04/28/2023   BUN 13 04/28/2023   NA 141 04/28/2023   K 4.5 04/28/2023   CL 105 04/28/2023   CO2 25 04/28/2023    Lab Results  Component Value Date   CHOL 156 04/28/2023   HDL 40 04/28/2023   LDLCALC 101 (H) 04/28/2023   LDLDIRECT 107 (H) 04/28/2023   TRIG 80 04/28/2023   CHOLHDL 3.8 09/16/2022     Medications Reviewed Today     Reviewed by Sherrill Raring, RPH (Pharmacist) on 05/29/23 at 1552  Med List Status: <None>   Medication Order Taking? Sig Documenting Provider Last Dose Status Informant  amLODipine (NORVASC) 10 MG tablet [id number]  Take 1 tablet (10 mg total) by mouth daily. Tollie Eth, NP  Active   Dulaglutide (TRULICITY) 3 MG/0.5ML Ivory Broad [id number] Yes Inject 3 mg as directed once a week. Tollie Eth, NP Taking Active   Dulaglutide (TRULICITY) 4.5 MG/0.5ML Ivory Broad [id number]  Inject 4.5 mg as directed once a week. Tollie Eth, NP  Active   escitalopram (LEXAPRO) 10 MG tablet [id number]  Take 1 tablet (10 mg total) by mouth at bedtime. Tollie Eth, NP  Active   fluconazole (DIFLUCAN) 150 MG tablet [id number]  Take one tablet today and second tablet in 3 days. Tollie Eth, NP  Active   Insulin Pen Needle 32G X 4 MM MISC [id number]  Use with insulin medication Henson, Vickie L, NP-C  Active   Iron, Ferrous Sulfate, 325 (65 Fe) MG TABS [id number]  Take one tablet twice a week for low iron. Tollie Eth, NP  Active   losartan-hydrochlorothiazide (HYZAAR) 100-25 MG tablet [id number]  Take 1 tablet by mouth daily. Tollie Eth, NP  Active   metFORMIN (GLUCOPHAGE) 1000 MG tablet [id number] Yes TAKE 1 TABLET DAILY WITH BREAKFAST. Tollie Eth, NP Taking Active   Vitamin D, Ergocalciferol, (DRISDOL) 1.25 MG (50000 UNIT) CAPS capsule [id number]  Take one capsule by mouth twice a week for vitamin D  deficiency. Tollie Eth, NP  Active               Assessment/Plan:   Diabetes: - Currently uncontrolled - Reviewed long term cardiovascular and renal outcomes of uncontrolled blood sugar - Reviewed goal A1c, goal fasting, and goal 2 hour post prandial glucose - Reviewed dietary modifications including low carb diet - Recommend to increase to 4.5mg  on trulicity on 06/08/23, prior auth submitted via covermymeds (Key: [id number], pending decision)  - Patient denies personal or  family history of multiple endocrine neoplasia type 2, medullary thyroid cancer; personal history of pancreatitis or gallbladder disease. - Recommend to check glucose daily, sending in new supplies per standing protocol -Future consideration: increasing metformin to trial toleration, addition of SGLT2     Follow Up Plan: within 72 business hours to notify of Trulicity prior auth status and schedule f/u  Sherrill Raring, PharmD Clinical Pharmacist [phone]

## 2023-05-30 ENCOUNTER — Telehealth: Payer: Self-pay

## 2023-05-30 NOTE — Progress Notes (Signed)
   05/30/2023  Patient ID: Jane Lopez, female   DOB: Jul 15, 1960, 63 y.o.   MRN: [id number]  Prior authorization for Trulicity 4.5mg  approved via CoverMyMeds (Key: [id number]) through 05/29/24. Notified patient and she will pick up from pharmacy this week as discussed.  Instructed to call back with any unexpected issues picking up and reminded of appt with PCP 06/26/23.  Will reach back out to follow up as needed following PCP visit.  Sherrill Raring, PharmD Clinical Pharmacist [phone]

## 2023-06-26 ENCOUNTER — Encounter: Payer: Self-pay | Admitting: Nurse Practitioner

## 2023-06-26 ENCOUNTER — Ambulatory Visit (INDEPENDENT_AMBULATORY_CARE_PROVIDER_SITE_OTHER): Payer: 59 | Admitting: Nurse Practitioner

## 2023-06-26 VITALS — BP 128/82 | HR 64 | Wt 258.0 lb

## 2023-06-26 DIAGNOSIS — M2769 Other endosseous dental implant failure: Secondary | ICD-10-CM | POA: Diagnosis not present

## 2023-06-26 DIAGNOSIS — M25579 Pain in unspecified ankle and joints of unspecified foot: Secondary | ICD-10-CM | POA: Diagnosis not present

## 2023-06-26 DIAGNOSIS — B379 Candidiasis, unspecified: Secondary | ICD-10-CM

## 2023-06-26 DIAGNOSIS — E118 Type 2 diabetes mellitus with unspecified complications: Secondary | ICD-10-CM | POA: Diagnosis not present

## 2023-06-26 NOTE — Progress Notes (Signed)
 Dell Fennel, DNP, AGNP-c [facility]  [address] [place], [place] [address] [phone]  ESTABLISHED PATIENT- Chronic Health and/or Follow-Up Visit  Blood pressure 128/82, pulse 64, weight 258 lb (117 kg).    Jane Lopez is a 63 y.o. year old female presenting today for evaluation and management of chronic conditions.   History of Present Illness Jane Lopez is a 63 year old female with diabetes who presents for medication management and follow-up.  She is currently on Trulicity , planning to start the 4.5 mg dose on Sunday. She previously used this dose before an insurance change required her to restart the medication. She has not been checking her blood sugar due to issues with her glucose meter, but her insurance has approved a new one.  She experiences increased hunger, which she attributes to previously inadequate food intake. She notes that food, particularly meat, does not taste the same as before. Her weight has increased slightly from 254 to 257 pounds, and she aims to lose weight by managing her calorie intake.  Her mood has improved, and she feels better and not sad, especially while on vacation. She attributes previous sadness to missing her mother, considering it a temporary issue.  She has skin discoloration and scarring on her feet, attributed to shoe rubbing. She has a history of a yeast infection, which has improved with Monistat cream after Diflucan  was not fully effective. She describes a lingering sensation during urination but notes improvement.  She underwent dental surgery for an implant, which was rejected, requiring a repeat procedure. She has not taken the prescribed antibiotics as she is not experiencing pain. The stitches are starting to dissolve.  She experiences ankle pain, particularly after standing at work all day, described as a sprain-like pain around the front and side of the ankle. She does not currently use any  support like an ACE bandage but is considering it for work.  All ROS negative with exception of what is listed above.   PHYSICAL EXAM Physical Exam Vitals and nursing note reviewed.  Constitutional:      Appearance: Normal appearance.  HENT:     Head: Normocephalic.  Eyes:     Pupils: Pupils are equal, round, and reactive to light.  Cardiovascular:     Rate and Rhythm: Normal rate and regular rhythm.     Pulses: Normal pulses.     Heart sounds: Normal heart sounds.  Pulmonary:     Effort: Pulmonary effort is normal.     Breath sounds: Normal breath sounds.  Musculoskeletal:        General: Normal range of motion.     Cervical back: Normal range of motion.  Skin:    General: Skin is warm.  Neurological:     General: No focal deficit present.     Mental Status: She is alert and oriented to person, place, and time.  Psychiatric:        Mood and Affect: Mood normal.      PLAN Problem List Items Addressed This Visit     Morbid obesity (HCC)   She is aiming to lose an additional 50 pounds. The importance of a balanced diet and regular exercise in achieving weight loss goals was discussed. A caloric intake of 1600-1700 calories per day was recommended to achieve a weight loss of 1-1.5 pounds per week, with potential for greater loss with added exercise. - Encourage adherence to the provided diet plan and calorie goals. - Promote regular physical activity to enhance weight  loss.      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 Increased hunger may be related to diabetes management. She has not been checking blood glucose due to issues with her glucose meter, but a new one has been approved. Maintaining steady blood glucose levels to prevent weight gain was discussed, along with dietary management. A caloric intake of 1600-1700 calories per day was recommended to achieve a weight loss of 1-1.5 pounds per week,  with potential for greater loss with added exercise. - Start Trulicity  4.5 mg on Sunday. - Ensure new glucose meter is obtained and used regularly. - Maintain a high-protein, low-carbohydrate diet. - Aim for 1600-1700 calories per day to lose 1-1.5 pounds per week. - Provide diet plan and snack options.       Candida infection   She reports a persistent yeast infection with burning at the end of urination. Previous treatment with Diflucan  was not fully effective, and she found relief with Monistat cream. Vaginal atrophy may contribute to symptoms, as she experiences dryness and irritation. Aquaphor ointment was suggested to alleviate dryness and irritation. - Use Aquaphor ointment around the vaginal opening to alleviate dryness and irritation. - Use Monistat cream as needed.      Acute ankle pain   She reports ankle pain, particularly after prolonged standing, suggesting a possible ligament sprain. Pain is located around the front and side of the ankle. An ACE bandage or ankle support was recommended for stability during work. - Use an ACE bandage or ankle support while working. - Consider further evaluation if pain persists.       Failure of endosseous dental implant   She experienced rejection of a dental implant, requiring repeat surgery. She reports no current pain and is managing with ibuprofen . She is concerned about the success of the current implant and is considering alternative options if this attempt fails. She expressed a desire for a refund and alternative dental solutions if the current implant does not succeed. - Monitor healing process and follow up with dental provider as scheduled. - Consider alternative dental solutions if current implant fails.       Return in about 3 months (around 09/25/2023) for Med Management 30. Time: 55 minutes, >50% spent counseling, care coordination, chart review, and documentation.   Dell Fennel, DNP, AGNP-c

## 2023-06-26 NOTE — Patient Instructions (Addendum)
 1600-1700 calories a day will help you lose 1-1.5 lbs a week  In your diet, you want about 90 grams of protein every day and no more than 180 grams of carbohydrates.   Try rubbing a small amount of Aquaphor around the vagina opening at bedtime to see if this helps with the burning you have been feeling.

## 2023-07-07 DIAGNOSIS — M2769 Other endosseous dental implant failure: Secondary | ICD-10-CM | POA: Insufficient documentation

## 2023-07-07 DIAGNOSIS — M25579 Pain in unspecified ankle and joints of unspecified foot: Secondary | ICD-10-CM | POA: Insufficient documentation

## 2023-07-07 NOTE — Assessment & Plan Note (Signed)
 She experienced rejection of a dental implant, requiring repeat surgery. She reports no current pain and is managing with ibuprofen . She is concerned about the success of the current implant and is considering alternative options if this attempt fails. She expressed a desire for a refund and alternative dental solutions if the current implant does not succeed. - Monitor healing process and follow up with dental provider as scheduled. - Consider alternative dental solutions if current implant fails.

## 2023-07-07 NOTE — Assessment & Plan Note (Signed)
 She reports a persistent yeast infection with burning at the end of urination. Previous treatment with Diflucan  was not fully effective, and she found relief with Monistat cream. Vaginal atrophy may contribute to symptoms, as she experiences dryness and irritation. Aquaphor ointment was suggested to alleviate dryness and irritation. - Use Aquaphor ointment around the vaginal opening to alleviate dryness and irritation. - Use Monistat cream as needed.

## 2023-07-07 NOTE — Assessment & Plan Note (Signed)
 She is aiming to lose an additional 50 pounds. The importance of a balanced diet and regular exercise in achieving weight loss goals was discussed. A caloric intake of 1600-1700 calories per day was recommended to achieve a weight loss of 1-1.5 pounds per week, with potential for greater loss with added exercise. - Encourage adherence to the provided diet plan and calorie goals. - Promote regular physical activity to enhance weight loss.

## 2023-07-07 NOTE — Assessment & Plan Note (Signed)
 She reports ankle pain, particularly after prolonged standing, suggesting a possible ligament sprain. Pain is located around the front and side of the ankle. An ACE bandage or ankle support was recommended for stability during work. - Use an ACE bandage or ankle support while working. - Consider further evaluation if pain persists.

## 2023-07-07 NOTE — Assessment & Plan Note (Signed)
 She is transitioning to Trulicity  4.5 mg from 3 mg. Increased hunger may be related to diabetes management. She has not been checking blood glucose due to issues with her glucose meter, but a new one has been approved. Maintaining steady blood glucose levels to prevent weight gain was discussed, along with dietary management. A caloric intake of 1600-1700 calories per day was recommended to achieve a weight loss of 1-1.5 pounds per week, with potential for greater loss with added exercise. - Start Trulicity  4.5 mg on Sunday. - Ensure new glucose meter is obtained and used regularly. - Maintain a high-protein, low-carbohydrate diet. - Aim for 1600-1700 calories per day to lose 1-1.5 pounds per week. - Provide diet plan and snack options.

## 2023-08-25 ENCOUNTER — Telehealth: Payer: Self-pay

## 2023-08-25 DIAGNOSIS — E118 Type 2 diabetes mellitus with unspecified complications: Secondary | ICD-10-CM

## 2023-08-25 NOTE — Progress Notes (Signed)
   08/25/2023 Name: Jane Lopez MRN: [id number] DOB: December 18, 1960  Chief Complaint  Patient presents with   Diabetes    Jane Lopez is a 63 y.o. year old female who presented for a telephone visit.   They were referred to the pharmacist for assistance in managing diabetes (TNM)   Subjective:  Care Team: Primary Care Provider: Annella Kief, NP ; Next Scheduled Visit: not scheduled  Medication Access/Adherence  Current Pharmacy:  [id number] [place], [place] - [address] [facility] [address] [place] [address] Phone: [phone] Fax: [phone]   Patient reports affordability concerns with their medications: No Patient reports access/transportation concerns to their pharmacy: No  Patient reports adherence concerns with their medications:  No     Diabetes:  Current medications: Trulicity  4.5mg , Metformin  1000mg  Medications tried in the past: Toujeo , Ozempic , Victoza   Current glucose readings: fasting 122 this morning, checks once daily at varying times, denies any low BG. Reports readings mostly staying under 130 fasting and under 200 post-prandial since increasing trulicity  Patient denies hypoglycemic s/sx including dizziness, shakiness, sweating. Patient denies hyperglycemic symptoms including polyuria, polydipsia, polyphagia, nocturia, neuropathy, blurred vision.  Current medication access support: None   Objective:  Lab Results  Component Value Date   HGBA1C 8.8 (H) 04/28/2023    Lab Results  Component Value Date   CREATININE 1.02 (H) 04/28/2023   BUN 13 04/28/2023   NA 141 04/28/2023   K 4.5 04/28/2023   CL 105 04/28/2023   CO2 25 04/28/2023    Lab Results  Component Value Date   CHOL 156 04/28/2023   HDL 40 04/28/2023   LDLCALC 101 (H) 04/28/2023   LDLDIRECT 107 (H) 04/28/2023   TRIG 80 04/28/2023   CHOLHDL 3.8 09/16/2022    Medications Reviewed Today   Medications were not reviewed  in this encounter       Assessment/Plan:   Diabetes: - Currently uncontrolled per A1c but reporting controlled readings at home - Reviewed long term cardiovascular and renal outcomes of uncontrolled blood sugar - Reviewed goal A1c, goal fasting, and goal 2 hour post prandial glucose - Reviewed dietary modifications including low carb diet - Patient denies personal or family history of multiple endocrine neoplasia type 2, medullary thyroid cancer; personal history of pancreatitis or gallbladder disease. - Recommend to check glucose daily -Future consideration: increasing metformin  to trial toleration, addition of SGLT2 if A1c elevated at next check    Follow Up Plan: 10/02/23  Carnell Christian, PharmD Clinical Pharmacist [phone]

## 2023-09-16 ENCOUNTER — Other Ambulatory Visit: Payer: Self-pay | Admitting: Nurse Practitioner

## 2023-09-16 ENCOUNTER — Telehealth: Payer: Self-pay | Admitting: Nurse Practitioner

## 2023-09-16 DIAGNOSIS — E1169 Type 2 diabetes mellitus with other specified complication: Secondary | ICD-10-CM

## 2023-09-16 DIAGNOSIS — E118 Type 2 diabetes mellitus with unspecified complications: Secondary | ICD-10-CM

## 2023-09-16 DIAGNOSIS — E1159 Type 2 diabetes mellitus with other circulatory complications: Secondary | ICD-10-CM

## 2023-09-16 MED ORDER — TRULICITY 4.5 MG/0.5ML ~~LOC~~ SOAJ: 4.5000 mg | SUBCUTANEOUS | 1 refills | Status: DC

## 2023-09-16 NOTE — Telephone Encounter (Signed)
 Copied from CRM [id number]. Topic: Clinical - Medication Refill >> Sep 16, 2023  8:56 AM Kevelyn M wrote: Medication: Dulaglutide  (TRULICITY ) 4.5 MG/0.5ML SOAJ  Has the patient contacted their pharmacy? Yes (Agent: If no, request that the patient contact the pharmacy for the refill. If patient does not wish to contact the pharmacy document the reason why and proceed with request.) (Agent: If yes, when and what did the pharmacy advise?)  This is the patient's preferred pharmacy:  [id number] [place], [place] - [address] [place] [address] Phone: [phone] Fax: [phone]  Is this the correct pharmacy for this prescription? Yes If no, delete pharmacy and type the correct one.   Has the prescription been filled recently? No  Is the patient out of the medication? Yes  Has the patient been seen for an appointment in the last year OR does the patient have an upcoming appointment? Yes  Can we respond through MyChart? Yes  Agent: Please be advised that Rx refills may take up to 3 business days. We ask that you follow-up with your pharmacy.

## 2023-09-25 ENCOUNTER — Other Ambulatory Visit

## 2023-09-25 DIAGNOSIS — E118 Type 2 diabetes mellitus with unspecified complications: Secondary | ICD-10-CM

## 2023-09-25 DIAGNOSIS — I152 Hypertension secondary to endocrine disorders: Secondary | ICD-10-CM

## 2023-09-25 NOTE — Progress Notes (Signed)
 09/25/2023 Name: Jane Lopez MRN: [id number] DOB: 28-Mar-1960  Chief Complaint  Patient presents with   Medication Management   Diabetes    Jane Lopez is a 63 y.o. year old female who presented for a telephone visit.   They were referred to the pharmacist for assistance in managing diabetes (TNM)   Subjective:  Care Team: Primary Care Provider: Oris Camie FORBES, NP ; Next Scheduled Visit: not scheduled  Medication Access/Adherence  Current Pharmacy:  [id number] [place], [place] - [address] [facility] [address] [place] [address] Phone: [phone] Fax: [phone]   Patient reports affordability concerns with their medications: No Patient reports access/transportation concerns to their pharmacy: No  Patient reports adherence concerns with their medications:  No  - fill dates suggest poor adherence but patient denies missed doses   Diabetes:  Current medications: Trulicity  4.5mg , Metformin  1000mg  Medications tried in the past: Toujeo , Ozempic , Victoza   Current glucose readings: Has not been checking Patient denies hypoglycemic s/sx including dizziness, shakiness, sweating. Patient denies hyperglycemic symptoms including polyuria, polydipsia, polyphagia, nocturia, neuropathy, blurred vision.  Current medication access support: None   Objective:  Lab Results  Component Value Date   HGBA1C 8.8 (H) 04/28/2023    Lab Results  Component Value Date   CREATININE 1.02 (H) 04/28/2023   BUN 13 04/28/2023   NA 141 04/28/2023   K 4.5 04/28/2023   CL 105 04/28/2023   CO2 25 04/28/2023    Lab Results  Component Value Date   CHOL 156 04/28/2023   HDL 40 04/28/2023   LDLCALC 101 (H) 04/28/2023   LDLDIRECT 107 (H) 04/28/2023   TRIG 80 04/28/2023   CHOLHDL 3.8 09/16/2022    Medications Reviewed Today     Reviewed by Lionell Jon DEL, RPH (Pharmacist) on 09/25/23 at 1610  Med List Status: <None>    Medication Order Taking? Sig Documenting Provider Last Dose Status Informant  amLODipine  (NORVASC ) 10 MG tablet [id number] Yes Take 1 tablet (10 mg total) by mouth daily. Early, Jane E, NP  Active   Blood Glucose Monitoring Suppl (ONETOUCH VERIO FLEX SYSTEM) w/Device KIT [id number]  1 each by Does not apply route daily. Use to check blood sugar once daily Early, Jane E, NP  Active   Dulaglutide  (TRULICITY ) 4.5 MG/0.5ML [place] [id number] Yes Inject 4.5 mg as directed once a week. Early, Jane E, NP  Active   escitalopram  (LEXAPRO ) 10 MG tablet [id number]  Take 1 tablet (10 mg total) by mouth at bedtime.  Patient not taking: Reported on 06/26/2023   Jane Camie FORBES, NP  Active   fluconazole  (DIFLUCAN ) 150 MG tablet [id number]  Take one tablet today and second tablet in 3 days.  Patient not taking: Reported on 06/26/2023   Jane Camie FORBES, NP  Active   glucose blood [facility] VERIO) test strip [id number]  Use one test strip daily to check blood sugar as instructed. Early, Jane E, NP  Active   Insulin  Pen Needle 32G X 4 MM MISC [id number]  Use with insulin  medication Lopez, Jane L, NP-C  Active   Iron , Ferrous Sulfate , 325 (65 Fe) MG TABS [id number]  Take one tablet twice a week for low iron . Early, Jane E, NP  Active   losartan -hydrochlorothiazide (HYZAAR) 100-25 MG tablet [id number] Yes Take 1 tablet by mouth daily. Early, Jane E, NP  Active   metFORMIN  (GLUCOPHAGE ) 1000 MG tablet [id number] Yes TAKE 1 TABLET DAILY WITH BREAKFAST. Jane Camie  E, NP  Active   OneTouch Delica Lancets 33G MISC [id number]  100 each by Does not apply route daily. Use 1 lancet to check sugar once daily as instructed Early, Jane E, NP  Active   Vitamin D , Ergocalciferol , (DRISDOL ) 1.25 MG (50000 UNIT) CAPS capsule [id number]  Take one capsule by mouth twice a week for vitamin D  deficiency. Jane Camie BRAVO, NP  Active               Assessment/Plan:   Diabetes: - Currently uncontrolled and not checking sugars at home - Reviewed  long term cardiovascular and renal outcomes of uncontrolled blood sugar - Reviewed goal A1c, goal fasting, and goal 2 hour post prandial glucose - Reviewed dietary modifications including low carb diet - Patient denies personal or family history of multiple endocrine neoplasia type 2, medullary thyroid cancer; personal history of pancreatitis or gallbladder disease. - Recommend to check glucose daily -Future consideration: increasing metformin  to trial toleration, addition of SGLT2 if A1c elevated at next check, patient declines to schedule PCP follow up today    Follow Up Plan: 10/16/23  Jon VEAR [place], PharmD Clinical Pharmacist [phone]

## 2023-10-16 ENCOUNTER — Other Ambulatory Visit: Payer: Self-pay

## 2023-11-20 ENCOUNTER — Encounter: Payer: Self-pay | Admitting: Nurse Practitioner

## 2023-11-20 ENCOUNTER — Ambulatory Visit: Payer: Self-pay | Admitting: Nurse Practitioner

## 2023-11-20 VITALS — BP 128/82 | HR 76 | Wt 256.2 lb

## 2023-11-20 DIAGNOSIS — E559 Vitamin D deficiency, unspecified: Secondary | ICD-10-CM

## 2023-11-20 DIAGNOSIS — E1159 Type 2 diabetes mellitus with other circulatory complications: Secondary | ICD-10-CM

## 2023-11-20 DIAGNOSIS — E118 Type 2 diabetes mellitus with unspecified complications: Secondary | ICD-10-CM | POA: Diagnosis not present

## 2023-11-20 DIAGNOSIS — Z1231 Encounter for screening mammogram for malignant neoplasm of breast: Secondary | ICD-10-CM

## 2023-11-20 DIAGNOSIS — E1169 Type 2 diabetes mellitus with other specified complication: Secondary | ICD-10-CM | POA: Diagnosis not present

## 2023-11-20 DIAGNOSIS — B379 Candidiasis, unspecified: Secondary | ICD-10-CM

## 2023-11-20 DIAGNOSIS — F17200 Nicotine dependence, unspecified, uncomplicated: Secondary | ICD-10-CM

## 2023-11-20 DIAGNOSIS — E611 Iron deficiency: Secondary | ICD-10-CM

## 2023-11-20 DIAGNOSIS — I152 Hypertension secondary to endocrine disorders: Secondary | ICD-10-CM

## 2023-11-20 DIAGNOSIS — E782 Mixed hyperlipidemia: Secondary | ICD-10-CM

## 2023-11-20 MED ORDER — SEMAGLUTIDE (1 MG/DOSE) 4 MG/3ML ~~LOC~~ SOPN: 1.0000 mg | PEN_INJECTOR | SUBCUTANEOUS | 0 refills | Status: DC

## 2023-11-20 MED ORDER — FLUCONAZOLE 150 MG PO TABS: ORAL_TABLET | ORAL | 2 refills | Status: DC

## 2023-11-20 NOTE — Assessment & Plan Note (Signed)
 Discussion of weight management by changing Trulicity  to Ozempic  to see if there is improvement in blood sugar and weight. Recommend daily exercise of at least 20 minutes.

## 2023-11-20 NOTE — Progress Notes (Signed)
 Catheline Doing, DNP, AGNP-c [facility]  [address] [place], [place] [address] [phone]  ESTABLISHED PATIENT- Chronic Health and/or Follow-Up Visit on 11/20/2023  Blood pressure 128/82, pulse 76, weight 256 lb 3.2 oz (116.2 kg).   HPI: History of Present Illness Jane Lopez is a 63 year old female with diabetes who presents for a follow-up visit.  She is currently using Trulicity  for diabetes management but experiences a burning sensation upon injection and feels no different. She has not tried any other medications for diabetes. She checks her blood sugar levels at home when she feels unwell, but not regularly. She feels tired at times and has experienced increased hunger and frequent urination, sometimes in small amounts. Her weight has fluctuated between 250 and 260 pounds over the past year, with a gradual decrease from 274 pounds a year ago to 256 pounds currently.  She has a history of hypertension and is currently taking amlodipine  and losartan . She experiences numbness in her big toe, which has improved since starting iron  supplements.  She has a history of iron  deficiency and takes iron  tablets occasionally. She has not had her iron  levels checked recently.  She has regular bowel movements, typically once a day, and denies any constipation or abdominal pain.  She has experienced recurrent yeast infections, which she associates with high blood sugar levels. She has previously been treated with fluconazole , but the infections have persisted.  She had a hysterectomy years ago and is unsure if her cervix was removed.  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 She finds the work environment cold, which affects her comfort.  All ROS negative with exception of what is listed above.   PHYSICAL EXAM Physical Exam Vitals and nursing note reviewed.  Constitutional:      Appearance: Normal appearance. She is  obese.  HENT:     Head: Normocephalic.  Eyes:     Pupils: Pupils are equal, round, and reactive to light.  Cardiovascular:     Rate and Rhythm: Normal rate and regular rhythm.     Pulses: Normal pulses.     Heart sounds: Normal heart sounds.  Pulmonary:     Effort: Pulmonary effort is normal.     Breath sounds: Normal breath sounds.  Musculoskeletal:        General: Normal range of motion.     Cervical back: Normal range of motion.     Right lower leg: No edema.     Left lower leg: No edema.  Skin:    General: Skin is warm.     Capillary Refill: Capillary refill takes less than 2 seconds.  Neurological:     General: No focal deficit present.     Mental Status: She is alert and oriented to person, place, and time.  Psychiatric:        Mood and Affect: Mood normal.     PLAN Problem List Items Addressed This Visit     Hypertension associated with diabetes (HCC)   Hypertension managed with amlodipine  and losartan . Blood pressure is well-controlled.      Relevant Medications   Semaglutide , 1 MG/DOSE, 4 MG/3ML SOPN   fluconazole  (DIFLUCAN ) 150 MG tablet   Morbid obesity (HCC)   Discussion of weight management by changing Trulicity  to Ozempic  to see if there is improvement in blood sugar and weight. Recommend daily exercise of at least 20 minutes.       Relevant Medications   Semaglutide , 1  MG/DOSE, 4 MG/3ML SOPN   fluconazole  (DIFLUCAN ) 150 MG tablet   Smoker   Lung CT due.       Relevant Medications   fluconazole  (DIFLUCAN ) 150 MG tablet   Other Relevant Orders   Ambulatory Referral Lung Cancer Screening [facility]   Mixed hyperlipidemia due to type 2 diabetes mellitus (HCC)   Currently managed with statin therapy. Will continue.      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 Reports burning sensation with Trulicity  injections, increased urination, and hunger. Weight decreased from 274 lbs a year ago to 256 lbs today. Recurrent vulvovaginal candidiasis possibly related to hyperglycemia. - Provide sample of Ozempic  0.5 mg weekly for four weeks - Hold remaining Trulicity  dose - Initiate prior authorization for Ozempic  - Order A1c finger stick - Prescribe fluconazole  150 mg, one tablet every third day for four doses      Relevant Medications   Semaglutide , 1 MG/DOSE, 4 MG/3ML SOPN   fluconazole  (DIFLUCAN ) 150 MG tablet   Iron  deficiency   Iron  deficiency anemia, previously treated with iron  tablets. Reports improvement in symptoms with intermittent iron  tablet use.       Candida infection   Relevant Medications   fluconazole  (DIFLUCAN ) 150 MG tablet   Vitamin D  deficiency   Relevant Medications   fluconazole  (DIFLUCAN ) 150 MG tablet   Other Visit Diagnoses       Screening mammogram for breast cancer       Relevant Orders   MM 3D SCREENING MAMMOGRAM BILATERAL BREAST       Labs not covered by insurance. Will avoid additional costs today and monitor A1c. At next visit, full labs required.   Return in about 6 months (around 05/19/2024) for CPE.  [name], DNP, AGNP-c Time: 39 minutes, >50% spent counseling, care coordination, chart review, and documentation.  SABRAtime

## 2023-11-20 NOTE — Assessment & Plan Note (Signed)
 Iron  deficiency anemia, previously treated with iron  tablets. Reports improvement in symptoms with intermittent iron  tablet use.

## 2023-11-20 NOTE — Assessment & Plan Note (Signed)
 Currently managed with statin therapy. Will continue.

## 2023-11-20 NOTE — Assessment & Plan Note (Signed)
 Type 2 diabetes mellitus with obesity, managed with Trulicity . Reports burning sensation with Trulicity  injections, increased urination, and hunger. Weight decreased from 274 lbs a year ago to 256 lbs today. Recurrent vulvovaginal candidiasis possibly related to hyperglycemia. - Provide sample of Ozempic  0.5 mg weekly for four weeks - Hold remaining Trulicity  dose - Initiate prior authorization for Ozempic  - Order A1c finger stick - Prescribe fluconazole  150 mg, one tablet every third day for four doses

## 2023-11-20 NOTE — Assessment & Plan Note (Signed)
 Lung CT due.

## 2023-11-20 NOTE — Assessment & Plan Note (Signed)
 Hypertension managed with amlodipine  and losartan . Blood pressure is well-controlled.

## 2023-11-21 ENCOUNTER — Encounter: Payer: Self-pay | Admitting: *Deleted

## 2023-11-24 ENCOUNTER — Other Ambulatory Visit (HOSPITAL_COMMUNITY): Payer: Self-pay

## 2023-11-25 ENCOUNTER — Telehealth: Payer: Self-pay

## 2023-11-25 ENCOUNTER — Other Ambulatory Visit (HOSPITAL_COMMUNITY): Payer: Self-pay

## 2023-11-25 DIAGNOSIS — E118 Type 2 diabetes mellitus with unspecified complications: Secondary | ICD-10-CM

## 2023-11-25 NOTE — Telephone Encounter (Signed)
 Pharmacy Patient Advocate Encounter  We have received a Prior Authorization request. However No P/A but cost is high   Insurance verification completed.   The patient is insured through [employer] test claim for Ozempic  1MG  4MG /3ML. Currently a quantity of is a 30 day supply and the co-pay is 907.00 FOR WITH INSURANCE. Likely this is due to a deductible not being met yet  .   This test claim was processed through [facility]- copay amounts may vary at other pharmacies due to pharmacy/plan contracts, or as the patient moves through the different stages of their insurance plan.

## 2023-11-27 NOTE — Telephone Encounter (Signed)
 Hey, unfortunately patient is not eligible for the patient assistance program since she has Nurse, learning disability. Company copay card would not be helpful either as it only lowers the cost by $150, still very expensive.  Possible alternative would be the oral semaglutide  Rybelsus . Would need a prior auth to see the cost but may be cheaper. We do have the 3mg  samples we could start the patient on. OR keep on trulicity  and consider addition of jardiance for glycemic control.

## 2023-12-02 MED ORDER — RYBELSUS 3 MG PO TABS: 3.0000 mg | ORAL_TABLET | Freq: Every day | ORAL | 3 refills | Status: DC

## 2023-12-02 NOTE — Addendum Note (Signed)
 Addended by: Yeily Link, CAMIE E on: 12/02/2023 04:32 PM   Modules accepted: Orders

## 2023-12-02 NOTE — Telephone Encounter (Signed)
 I sent in 3mg  of Rybelsus , but we can certainly give her samples to try.

## 2023-12-03 ENCOUNTER — Telehealth: Payer: Self-pay

## 2023-12-03 NOTE — Progress Notes (Signed)
   12/03/2023  Patient ID: Jane Lopez, female   DOB: 09-Mar-1961, 63 y.o.   MRN: [id number]  Attempted to contact patient to schedule appt for medication management in regards to DM. Left HIPAA compliant message for patient to return my call at their convenience.   Jon VEAR Lindau, PharmD Clinical Pharmacist [phone]

## 2023-12-04 ENCOUNTER — Other Ambulatory Visit (HOSPITAL_COMMUNITY): Payer: Self-pay

## 2023-12-04 ENCOUNTER — Telehealth: Payer: Self-pay

## 2023-12-04 NOTE — Telephone Encounter (Signed)
 Pharmacy Patient Advocate Encounter   Received notification from Onbase that prior authorization for Rybelsus  3MG  tablets  is required/requested.   Insurance verification completed.   The patient is insured through [employer] .   Per test claim: PA required; PA submitted to above mentioned insurance via Latent Key/confirmation #/EOC [id number]  Status is pending

## 2023-12-05 ENCOUNTER — Other Ambulatory Visit (HOSPITAL_COMMUNITY): Payer: Self-pay

## 2023-12-05 NOTE — Telephone Encounter (Signed)
 Pharmacy Patient Advocate Encounter  Received notification from [employer] that Prior Authorization for Rybelsus  3MG  tablets has been APPROVED from 9.25.25 to 9.25.26. Ran test claim, Copay is $RTS, RX LAST FILLED ON 9.25.25. This test claim was processed through [facility]- copay amounts may vary at other pharmacies due to pharmacy/plan contracts, or as the patient moves through the different stages of their insurance plan.   PA #/Case ID/Reference #: [id number]

## 2023-12-23 ENCOUNTER — Telehealth: Payer: Self-pay

## 2023-12-23 NOTE — Progress Notes (Signed)
   12/23/2023  Patient ID: Jane Lopez, female   DOB: 1960-06-04, 63 y.o.   MRN: [id number]  Attempted to contact patient for medication management and to follow up on rybelsus . Had to leave a voicemail asking for a return call. Will also send mychart message.  Jon VEAR Lindau, PharmD Clinical Pharmacist [phone]

## 2023-12-30 ENCOUNTER — Telehealth: Payer: Self-pay

## 2023-12-30 NOTE — Progress Notes (Signed)
   12/30/2023  Patient ID: Jane Lopez, female   DOB: [birth year], 63 y.o.   MRN: [id number]  Patient called in to report she was never able to start the Rybelsus , too expensive. She would like to go back on trulicity  if possible as this was affordable. Counseled that I will reach out to PCP to discuss but that we would also have to monitor sugars closely to see if something else needs to be added on.  Pending PCP response, scheduled follow up call for Thursday. Will reach out sooner if any updates.  Jon VEAR Lindau, PharmD Clinical Pharmacist [phone]

## 2023-12-31 MED ORDER — TRULICITY 4.5 MG/0.5ML ~~LOC~~ SOAJ: 4.5000 mg | SUBCUTANEOUS | 2 refills | Status: AC

## 2023-12-31 NOTE — Addendum Note (Signed)
 Addended by: LIONELL JON DEL on: 12/31/2023 09:24 AM   Modules accepted: Orders

## 2024-01-01 ENCOUNTER — Other Ambulatory Visit

## 2024-01-01 DIAGNOSIS — E118 Type 2 diabetes mellitus with unspecified complications: Secondary | ICD-10-CM

## 2024-01-01 NOTE — Progress Notes (Signed)
 01/01/2024 Name: Jane Lopez MRN: [id number] DOB: 08-16-1960  Chief Complaint  Patient presents with   Medication Management   Diabetes    Jane Lopez is a 63 y.o. year old female who presented for a telephone visit.   They were referred to the pharmacist for assistance in managing diabetes (TNM)   Subjective:  Care Team: Primary Care Provider: Oris Camie FORBES, NP ;  Medication Access/Adherence  Current Pharmacy:  [facility] [phone] [place], [place] - [phone] [facility] [address] [place] [address] Phone: [phone] Fax: [phone]   Patient reports affordability concerns with their medications: No Patient reports access/transportation concerns to their pharmacy: No  Patient reports adherence concerns with their medications:  No  - fill dates suggest poor adherence but patient denies missed doses   Diabetes:  Current medications: Trulicity  4.5mg , Metformin  1000mg  Medications tried in the past: Toujeo , Ozempic , Victoza   Current glucose readings: Has not been checking  Patient denies hypoglycemic s/sx including dizziness, shakiness, sweating. Patient denies hyperglycemic symptoms including polyuria, polydipsia, polyphagia, nocturia, neuropathy, blurred vision.  Tried to switch to ozempic  or rybelsus  last month but $$$$ - sent in order for Trulicity , patient plans to restart today  Patient requests updated a1c  Current medication access support: None   Objective:  Lab Results  Component Value Date   HGBA1C 8.8 (H) 04/28/2023    Lab Results  Component Value Date   CREATININE 1.02 (H) 04/28/2023   BUN 13 04/28/2023   NA 141 04/28/2023   K 4.5 04/28/2023   CL 105 04/28/2023   CO2 25 04/28/2023    Lab Results  Component Value Date   CHOL 156 04/28/2023   HDL 40 04/28/2023   LDLCALC 101 (H) 04/28/2023   LDLDIRECT 107 (H) 04/28/2023   TRIG 80 04/28/2023   CHOLHDL 3.8 09/16/2022     Medications Reviewed Today     Reviewed by Lionell Jon DEL, RPH (Pharmacist) on 01/01/24 at 1620  Med List Status: <None>   Medication Order Taking? Sig Documenting Provider Last Dose Status Informant  amLODipine  (NORVASC ) 10 MG tablet [id number]  Take 1 tablet (10 mg total) by mouth daily. Early, Sara E, NP  Active   Blood Glucose Monitoring Suppl (ONETOUCH VERIO FLEX SYSTEM) w/Device KIT [id number]  1 each by Does not apply route daily. Use to check blood sugar once daily Early, Sara E, NP  Active   Dulaglutide  (TRULICITY ) 4.5 MG/0.5ML EMMANUEL [id number]  Inject 4.5 mg as directed once a week. Early, Sara E, NP  Active   fluconazole  (DIFLUCAN ) 150 MG tablet [id number]  Take 1 tablet by mouth every 3 days for a total of 4 doses. Early, Sara E, NP  Active   glucose blood (ONETOUCH VERIO) test strip [id number]  Use one test strip daily to check blood sugar as instructed. Early, Sara E, NP  Active   Insulin  Pen Needle 32G X 4 MM MISC [id number]  Use with insulin  medication Henson, Vickie L, NP-C  Active   Iron , Ferrous Sulfate , 325 (65 Fe) MG TABS [id number]  Take one tablet twice a week for low iron . Early, Sara E, NP  Active   losartan -hydrochlorothiazide (HYZAAR) 100-25 MG tablet [id number]  Take 1 tablet by mouth daily. Early, Sara E, NP  Active   metFORMIN  (GLUCOPHAGE ) 1000 MG tablet [id number]  TAKE 1 TABLET DAILY WITH BREAKFAST. Early, Sara E, NP  Active   OneTouch Delica Lancets 33G MISC [id number]  100 each  by Does not apply route daily. Use 1 lancet to check sugar once daily as instructed Early, Sara E, NP  Active   Vitamin D , Ergocalciferol , (DRISDOL ) 1.25 MG (50000 UNIT) CAPS capsule [id number]  Take one capsule by mouth twice a week for vitamin D  deficiency. Oris Camie BRAVO, NP  Active               Assessment/Plan:   Diabetes: - Currently uncontrolled and not checking sugars at home - Reviewed long term cardiovascular and renal outcomes of uncontrolled blood sugar - Reviewed goal  A1c, goal fasting, and goal 2 hour post prandial glucose - Reviewed dietary modifications including low carb diet - Patient denies personal or family history of multiple endocrine neoplasia type 2, medullary thyroid cancer; personal history of pancreatitis or gallbladder disease. - Recommend to check glucose daily -Future consideration: increasing metformin  to trial toleration, addition of SGLT2 -Ordered A1C check, front office will reach out to schedule nurse/lab visit    Follow Up Plan: 01/22/24  Jon VEAR Lindau, PharmD Clinical Pharmacist [phone]

## 2024-01-08 ENCOUNTER — Other Ambulatory Visit: Payer: Self-pay

## 2024-01-08 DIAGNOSIS — E118 Type 2 diabetes mellitus with unspecified complications: Secondary | ICD-10-CM

## 2024-01-09 LAB — HEMOGLOBIN A1C
Est. average glucose Bld gHb Est-mCnc: 140 mg/dL
Hgb A1c MFr Bld: 6.5 % — ABNORMAL HIGH (ref 4.8–5.6)

## 2024-01-13 ENCOUNTER — Ambulatory Visit: Payer: Self-pay | Admitting: Nurse Practitioner

## 2024-01-13 ENCOUNTER — Telehealth: Payer: Self-pay | Admitting: Pharmacy Technician

## 2024-01-13 NOTE — Telephone Encounter (Signed)
 Pharmacy Patient Advocate Encounter   Received notification from Onbase that prior authorization for Ozempic  (0.25 or 0.5 MG/DOSE) 2MG /3ML pen-injectors is due for renewal.   Insurance verification completed.   The patient is insured through [employer].  Action: Medication has been discontinued. Archived Key: [id number]

## 2024-01-22 ENCOUNTER — Other Ambulatory Visit: Payer: Self-pay

## 2024-01-22 DIAGNOSIS — E118 Type 2 diabetes mellitus with unspecified complications: Secondary | ICD-10-CM

## 2024-01-22 NOTE — Progress Notes (Signed)
 01/22/2024 Name: Jane Lopez MRN: [id number] DOB: Jan 04, 1961  Chief Complaint  Patient presents with   Medication Management   Diabetes    Jane Lopez is a 63 y.o. year old female who presented for a telephone visit.   They were referred to the pharmacist for assistance in managing diabetes (TNM)   Subjective:  Care Team: Primary Care Provider: Oris Camie FORBES, NP ;  Medication Access/Adherence  [facility] Pharmacy[facility]  [facility] [phone] [place] [place] - [address] [facility] [address] [place] [address] Phone: [phone] Fax: [phone]   Patient reports affordability concerns with their medications: No Patient reports access/transportation concerns to their pharmacy: No  Patient reports adherence concerns with their medications:  No  - fill dates suggest poor adherence but patient denies missed doses   Diabetes:  Current medications: Trulicity  4.5mg , Metformin  1000mg  Medications tried in the past: Toujeo , Ozempic , Victoza   Current glucose readings: Has been checking once daily and denies any readings outside of target rnage  Patient denies hypoglycemic s/sx including dizziness, shakiness, sweating. Patient denies hyperglycemic symptoms including polyuria, polydipsia, polyphagia, nocturia, neuropathy, blurred vision.   Current medication access support: None  [facility] , but she did get another sample of ozempic  to bridge her supply and has not been without a GLP-1   Objective:  Lab Results  Component Value Date   HGBA1C 6.5 (H) 01/08/2024    Lab Results  Component Value Date   CREATININE 1.02 (H) 04/28/2023   BUN 13 04/28/2023   NA 141 04/28/2023   K 4.5 04/28/2023   CL 105 04/28/2023   CO2 25 04/28/2023    Lab Results  Component Value Date   CHOL 156 04/28/2023   HDL 40 04/28/2023   LDLCALC 101 (H) 04/28/2023   LDLDIRECT 107 (H) 04/28/2023   TRIG  80 04/28/2023   CHOLHDL 3.8 09/16/2022    Medications Reviewed Today     Reviewed by Lionell Jon DEL, RPH (Pharmacist) on 01/22/24 at 1559  Med List Status: <None>   Medication Order Taking? Sig Documenting Provider Last Dose Status Informant  amLODipine  (NORVASC ) 10 MG tablet [id number]  Take 1 tablet (10 mg total) by mouth daily. Early, Sara E, NP  Active   Blood Glucose Monitoring Suppl (ONETOUCH VERIO FLEX SYSTEM) w/Device KIT [id number]  1 each by Does not apply route daily. Use to check blood sugar once daily Early, Sara E, NP  Active   Dulaglutide  (TRULICITY ) 4.5 MG/0.5ML EMMANUEL [id number]  Inject 4.5 mg as directed once a week. Early, Sara E, NP  Active   fluconazole  (DIFLUCAN ) 150 MG tablet [id number]  Take 1 tablet by mouth every 3 days for a total of 4 doses. Early, Sara E, NP  Active   glucose blood (ONETOUCH VERIO) test strip [id number]  Use one test strip daily to check blood sugar as instructed. Early, Sara E, NP  Active   Insulin  Pen Needle 32G X 4 MM MISC [id number]  Use with insulin  medication Henson, Vickie L, NP-C  Active   Iron , Ferrous Sulfate , 325 (65 Fe) MG TABS [id number]  Take one tablet twice a week for low iron . Early, Sara E, NP  Active   losartan -hydrochlorothiazide (HYZAAR) 100-25 MG tablet [id number]  Take 1 tablet by mouth daily. Early, Sara E, NP  Active   metFORMIN  (GLUCOPHAGE ) 1000 MG tablet [id number]  TAKE 1 TABLET DAILY WITH BREAKFAST. Early, Sara E, NP  Active   OneTouch Delica Lancets 33G MISC [id number]  100 each by Does not apply route daily. Use 1 lancet to check sugar once daily as instructed Early, Sara E, NP  Active   Vitamin D , Ergocalciferol , (DRISDOL ) 1.25 MG (50000 UNIT) CAPS capsule [id number]  Take one capsule by mouth twice a week for vitamin D  deficiency. Oris Camie BRAVO, NP  Active               Assessment/Plan:   Diabetes: - Currently controlled - Reviewed long term cardiovascular and renal outcomes of uncontrolled blood sugar - Reviewed  goal A1c, goal fasting, and goal 2 hour post prandial glucose - Reviewed dietary modifications including low carb diet - Patient denies personal or family history of multiple endocrine neoplasia type 2, medullary thyroid cancer; personal history of pancreatitis or gallbladder disease. - Recommend to check glucose daily -Recommend to continue current med therapy    Follow Up Plan: 2 months  Jon VEAR Lindau, PharmD Clinical Pharmacist [phone]

## 2024-02-12 ENCOUNTER — Other Ambulatory Visit: Payer: Self-pay | Admitting: Nurse Practitioner

## 2024-02-12 DIAGNOSIS — L659 Nonscarring hair loss, unspecified: Secondary | ICD-10-CM

## 2024-02-12 DIAGNOSIS — E559 Vitamin D deficiency, unspecified: Secondary | ICD-10-CM

## 2024-02-12 NOTE — Telephone Encounter (Signed)
 Last Vitamin D  was in normal range. Not sure if we need to refill this or not

## 2024-02-17 ENCOUNTER — Telehealth: Payer: Self-pay

## 2024-02-17 ENCOUNTER — Other Ambulatory Visit (HOSPITAL_COMMUNITY): Payer: Self-pay

## 2024-02-17 NOTE — Telephone Encounter (Signed)
 Pharmacy Patient Advocate Encounter   Received notification from Patient Advice Request messages that prior authorization for (TRULICITY ) 4.5 MG/0.5ML SOAJ  is required/requested.   Insurance verification completed.   The patient is insured through [employer].   Per test claim: PA required; PA submitted to above mentioned insurance via Latent Key/confirmation #/EOC [id number] Status is pending

## 2024-02-17 NOTE — Telephone Encounter (Signed)
 P/a on file until 3.21.26   Update:    P/A For trulicity  is on currently on file and expires 05/29/24. Cost is around/about 25.00. Nothing further needed   T/C

## 2024-02-26 ENCOUNTER — Encounter: Payer: Self-pay | Admitting: Family Medicine

## 2024-02-26 ENCOUNTER — Other Ambulatory Visit: Payer: Self-pay | Admitting: Nurse Practitioner

## 2024-02-26 ENCOUNTER — Ambulatory Visit: Admitting: Family Medicine

## 2024-02-26 VITALS — BP 134/84 | HR 84 | Temp 99.0°F | Ht 68.0 in | Wt 262.0 lb

## 2024-02-26 DIAGNOSIS — N3001 Acute cystitis with hematuria: Secondary | ICD-10-CM | POA: Diagnosis not present

## 2024-02-26 DIAGNOSIS — Z23 Encounter for immunization: Secondary | ICD-10-CM | POA: Diagnosis not present

## 2024-02-26 DIAGNOSIS — F17200 Nicotine dependence, unspecified, uncomplicated: Secondary | ICD-10-CM | POA: Diagnosis not present

## 2024-02-26 DIAGNOSIS — R309 Painful micturition, unspecified: Secondary | ICD-10-CM | POA: Diagnosis not present

## 2024-02-26 DIAGNOSIS — E118 Type 2 diabetes mellitus with unspecified complications: Secondary | ICD-10-CM | POA: Diagnosis not present

## 2024-02-26 DIAGNOSIS — I499 Cardiac arrhythmia, unspecified: Secondary | ICD-10-CM

## 2024-02-26 DIAGNOSIS — E611 Iron deficiency: Secondary | ICD-10-CM

## 2024-02-26 LAB — POCT URINALYSIS DIP (PROADVANTAGE DEVICE)
Bilirubin, UA: NEGATIVE
Blood, UA: NEGATIVE
Glucose, UA: NEGATIVE mg/dL
Ketones, POC UA: NEGATIVE mg/dL
Nitrite, UA: NEGATIVE
Protein Ur, POC: NEGATIVE mg/dL
Specific Gravity, Urine: 1.01
Urobilinogen, Ur: 0.2
pH, UA: 7.5 (ref 5.0–8.0)

## 2024-02-26 MED ORDER — NITROFURANTOIN MONOHYD MACRO 100 MG PO CAPS: 100.0000 mg | ORAL_CAPSULE | Freq: Two times a day (BID) | ORAL | 0 refills | Status: DC

## 2024-02-26 NOTE — Progress Notes (Signed)
 Chief Complaint  Patient presents with   Urinary Tract Infection    For the last month she has felt like she is not emptying her bladder all the way. Also has some discomfort at the end of the stream. Saw some blood in urine Tuesday. She states she gets yeasts infections frequently. Tried OTC Monistat & and diflucan  and did not help.     She has a h/o frequent yeast infections.  She took meds prescribed by PCP, which helped a little (about a month ago), it helped, but symptoms came back. She used OTC Monistat treatment, which helped some. She continues to intermittently have burning at the end of voiding, not more of a discomfort. She has had urinary urgency and frequency (off and on for the last month). Urine has been cloudy, denies odor. She noticed blood in the urine (pink color) 2 days ago, but hasn't noticed it since then.  She hasn't been having any vaginal discharge or itching.  Diabetes--Blood sugars have been higher than normal, periodically sees a spike. She had samples of ozempic  for 2 weeks, within the last month. She has been having trouble getting her Trulicity --pharmacy app says it will be >$1000 when she tries to order it. Per chart--got prior auth and should cost $25--noted last week, hasn't been back to pharmacy yet to check with them direct.y.   PMH, PSH, SH reviewed  DM--currently only taking metformin , not on GLP (supposed to be).  Lab Results  Component Value Date   HGBA1C 6.5 (H) 01/08/2024   Outpatient Encounter Medications as of 02/26/2024  Medication Sig   amLODipine  (NORVASC ) 10 MG tablet Take 1 tablet (10 mg total) by mouth daily.   Blood Glucose Monitoring Suppl (ONETOUCH VERIO FLEX SYSTEM) w/Device KIT 1 each by Does not apply route daily. Use to check blood sugar once daily   glucose blood (ONETOUCH VERIO) test strip Use one test strip daily to check blood sugar as instructed.   Insulin  Pen Needle 32G X 4 MM MISC Use with insulin  medication   Iron ,  Ferrous Sulfate , 325 (65 Fe) MG TABS Take one tablet twice a week for low iron .   losartan -hydrochlorothiazide (HYZAAR) 100-25 MG tablet Take 1 tablet by mouth daily.   metFORMIN  (GLUCOPHAGE ) 1000 MG tablet TAKE 1 TABLET DAILY WITH BREAKFAST.   OneTouch Delica Lancets 33G MISC 100 each by Does not apply route daily. Use 1 lancet to check sugar once daily as instructed   [DISCONTINUED] Vitamin D , Ergocalciferol , (DRISDOL ) 1.25 MG (50000 UNIT) CAPS capsule Take one capsule by mouth twice a week for vitamin D  deficiency.   Dulaglutide  (TRULICITY ) 4.5 MG/0.5ML SOAJ Inject 4.5 mg as directed once a week. (Patient not taking: Reported on 02/26/2024)   [DISCONTINUED] fluconazole  (DIFLUCAN ) 150 MG tablet Take 1 tablet by mouth every 3 days for a total of 4 doses.   No facility-administered encounter medications on file as of 02/26/2024.   Allergies  Allergen Reactions   Penicillins Rash    ROS:  no known fever, chills. No n/v/d No HA, dizziness, CP. No URI symptoms, just chronic cough. +smoker.    PHYSICAL EXAM:  BP 134/84   Pulse 84   Temp 99 F (37.2 C) (Tympanic)   Ht 5' 8 (1.727 m)   Wt 262 lb (118.8 kg)   BMI 39.84 kg/m   Pleasant, well-appearing female in no distress HEENT: conjunctiva and sclera are clear, EOMI Neck: no lymphadenopathy Heart: regular rate and rhythm Lungs: clear bilaterally Back: no spinal or  CVA tenderness Abdomen: soft, nontender, no suprapubic tenderness  Urine: SG 1.010, 3+ leuks, nitrite negative and blood   ASSESSMENT/PLAN:  Acute cystitis with hematuria - culture sent, treat with macrobid . To pick up RF of diflucan  and start only if vaginal discharge/itch develops. - Plan: Urine Culture, nitrofurantoin , macrocrystal-monohydrate, (MACROBID ) 100 MG capsule  Pain with urination - Plan: POCT Urinalysis DIP (Proadvantage Device)  Need for COVID-19 vaccine - Plan: Pfizer Comirnaty Covid-19 Vaccine 21yrs & older  Type 2 diabetes mellitus with  complications (HCC) - To check with pharmacy--should have prior auth and be less expensive. f/u with our office if unable to get Trulicity . Monitor sugars  Smoker - risks reviewed, encouraged her ot continue to work on cessation  Stay well hydrated. We are treating your for a urinary tract infection. We are sending your urine for culture to make sure that we choose the right antibiotic (and also confirms that you have an infection). We will contact you to change your antibiotic if the one prescribed doesn't appear to be effective for your infection.  Taking antibiotics can cause yeast infections.  It looks like SaraBeth put refills on the diflucan  (fluconazole ) that was prescribed back in September. You likely have refills left.  Contact us  if you do not--you don't take this preventatively, but take it once you start to have any vaginal discharge or itch.   Please go to the pharmacy to get your Trulicity . Contact SaraBeth or the pharmacist Jon if you are having any issues.   Be sure to check your sugars regularly.  Please continue to work on quitting smoking.

## 2024-02-26 NOTE — Patient Instructions (Signed)
 Stay well hydrated. We are treating your for a urinary tract infection. We are sending your urine for culture to make sure that we choose the right antibiotic (and also confirms that you have an infection). We will contact you to change your antibiotic if the one prescribed doesn't appear to be effective for your infection.  Taking antibiotics can cause yeast infections.  It looks like SaraBeth put refills on the diflucan  (fluconazole ) that was prescribed back in September. You likely have refills left.  Contact us  if you do not--you don't take this preventatively, but take it once you start to have any vaginal discharge or itch.   Please go to the pharmacy to get your Trulicity . Contact SaraBeth or the pharmacist Jon if you are having any issues.   Be sure to check your sugars regularly.  Please continue to work on quitting smoking.

## 2024-03-01 ENCOUNTER — Ambulatory Visit: Payer: Self-pay | Admitting: Family Medicine

## 2024-03-01 LAB — URINE CULTURE

## 2024-03-18 ENCOUNTER — Other Ambulatory Visit (INDEPENDENT_AMBULATORY_CARE_PROVIDER_SITE_OTHER): Payer: Self-pay

## 2024-03-18 ENCOUNTER — Telehealth: Payer: Self-pay

## 2024-03-18 DIAGNOSIS — E118 Type 2 diabetes mellitus with unspecified complications: Secondary | ICD-10-CM

## 2024-03-18 NOTE — Progress Notes (Signed)
 "  03/18/2024 Name: Jane Lopez MRN: [id number] DOB: Dec 15, 1960  Chief Complaint  Patient presents with   Medication Management   Diabetes    Jane Lopez is a 64 y.o. year old female who presented for a telephone visit.   They were referred to the pharmacist for assistance in managing diabetes (TNM)   Subjective:  Care Team: Primary Care Provider: Oris Camie FORBES, NP ;  Medication Access/Adherence  Current Pharmacy:  [id number] - [place], [place] - [address] AT [facility] [address] [place] [address] Phone: [phone] Fax: [phone]   Patient reports affordability concerns with their medications: No Patient reports access/transportation concerns to their pharmacy: No  Patient reports adherence concerns with their medications:  No  - fill dates suggest poor adherence but patient denies missed doses   Diabetes:  Current medications: Trulicity  4.5mg , Metformin  1000mg  Medications tried in the past: Toujeo , Ozempic , Victoza   Current glucose readings: Has been checking once daily and denies any readings outside of target rnage  Patient denies hypoglycemic s/sx including dizziness, shakiness, sweating. Patient denies hyperglycemic symptoms including polyuria, polydipsia, polyphagia, nocturia, neuropathy, blurred vision.   Current medication access support: None  Reports insurance cvg expiring at end of month   Objective:  Lab Results  Component Value Date   HGBA1C 6.5 (H) 01/08/2024    Lab Results  Component Value Date   CREATININE 1.02 (H) 04/28/2023   BUN 13 04/28/2023   NA 141 04/28/2023   K 4.5 04/28/2023   CL 105 04/28/2023   CO2 25 04/28/2023    Lab Results  Component Value Date   CHOL 156 04/28/2023   HDL 40 04/28/2023   LDLCALC 101 (H) 04/28/2023   LDLDIRECT 107 (H) 04/28/2023   TRIG 80 04/28/2023   CHOLHDL 3.8 09/16/2022    Medications Reviewed Today     Reviewed by Lionell Jon DEL,  RPH (Pharmacist) on 03/18/24 at 1545  Med List Status: <None>   Medication Order Taking? Sig Documenting Provider Last Dose Status Informant  amLODipine  (NORVASC ) 10 MG tablet [id number]  Take 1 tablet (10 mg total) by mouth daily. Early, Sara E, NP  Active   Blood Glucose Monitoring Suppl (ONETOUCH VERIO FLEX SYSTEM) w/Device KIT [id number]  1 each by Does not apply route daily. Use to check blood sugar once daily Early, Sara E, NP  Active   Dulaglutide  (TRULICITY ) 4.5 MG/0.5ML EMMANUEL [id number]  Inject 4.5 mg as directed once a week.  Patient not taking: Reported on 02/26/2024   Oris Camie FORBES, NP  Active   ferrous sulfate  325 (65 FE) MG tablet [id number]  TAKE ONE TABLET TWICE A WEEK FOR LOW IRON . Early, Camie FORBES, NP  Active   glucose blood (ONETOUCH VERIO) test strip [id number]  Use one test strip daily to check blood sugar as instructed. Early, Sara E, NP  Active   Insulin  Pen Needle 32G X 4 MM MISC [id number]  Use with insulin  medication Henson, Vickie L, NP-C  Active   losartan -hydrochlorothiazide (HYZAAR) 100-25 MG tablet [id number]  Take 1 tablet by mouth daily. Early, Sara E, NP  Active   metFORMIN  (GLUCOPHAGE ) 1000 MG tablet [id number]  TAKE 1 TABLET DAILY WITH BREAKFAST. Early, Sara E, NP  Active   nitrofurantoin , macrocrystal-monohydrate, (MACROBID ) 100 MG capsule [id number]  Take 1 capsule (100 mg total) by mouth 2 (two) times daily. Randol Dawes, MD  Active   [facility] Lancets 33G [place] [id number]  100 each by Does  not apply route daily. Use 1 lancet to check sugar once daily as instructed Early, Camie BRAVO, NP  Active               Assessment/Plan:   Diabetes: - Currently controlled - Reviewed long term cardiovascular and renal outcomes of uncontrolled blood sugar - Reviewed goal A1c, goal fasting, and goal 2 hour post prandial glucose - Reviewed dietary modifications including low carb diet - Patient denies personal or family history of multiple endocrine neoplasia type 2, medullary  thyroid cancer; personal history of pancreatitis or gallbladder disease. - Recommend to check glucose daily -Recommend to continue current med therapy, obtain refill on all meds prior to insurance ending -Placed urgent referral to SW for SDOH needs in regards to help with insurance/medicaid possibility -If becomes uninsured, may be able to get ozempic  or trulicity  through PAP    Follow Up Plan: 1 month  Jon VEAR Lindau, PharmD Clinical Pharmacist [phone]   "

## 2024-03-18 NOTE — Progress Notes (Signed)
 Complex Care Management Note  Care Guide Note 03/18/2024 Name: Jane Lopez MRN: [id number] DOB: 20-Mar-1960  Jane Lopez is a 64 y.o. year old female who sees Early, Camie FORBES, NP for primary care. I reached out to Jane Lopez by phone today to offer complex care management services.  Jane Lopez was given information about Complex Care Management services today including:   The Complex Care Management services include support from the care team which includes your Nurse Care Manager, Clinical Social Worker, or Pharmacist.  The Complex Care Management team is here to help remove barriers to the health concerns and goals most important to you. Complex Care Management services are voluntary, and the patient may decline or stop services at any time by request to their care team member.   Complex Care Management Consent Status: Patient agreed to services and verbal consent obtained.   Follow up plan:  Telephone appointment with complex care management team member scheduled for:  03/24/24 and 03/26/24  Encounter Outcome:  Patient Scheduled  Debbe Fuse [facility] Health  [facility], [facility] Guide  Direct Dial : [phone]  Fax [phone]

## 2024-03-24 ENCOUNTER — Telehealth: Payer: Self-pay | Admitting: Licensed Clinical Social Worker

## 2024-03-24 ENCOUNTER — Encounter: Payer: Self-pay | Admitting: Licensed Clinical Social Worker

## 2024-03-26 ENCOUNTER — Other Ambulatory Visit: Payer: Self-pay

## 2024-03-26 NOTE — Patient Outreach (Addendum)
 Complex Care Management   Visit Note  03/26/2024  Name:  Jane Lopez MRN: [id number] DOB: 06/17/1960  Situation: Referral received for Complex Care Management related to Hypertension associated with diabetes, Hypertension associated with diabetes, Mixed hyperlipidemia due to type 2 diabetes mellitus, iron  deficiency anemia, bilateral pain to both legs, Numbness to toes. I obtained verbal consent from Patient.  Visit completed with Patient on the phone.  Background:   Past Medical History:  Diagnosis Date   Allergy    seasonal allergies   Diabetes (HCC)    Dyslipidemia 05/12/2020   Elevated LDL cholesterol level    Headache 11/10/2007   Qualifier: Diagnosis of   By: [place], CMA, Cindy      Replacing diagnoses that were inactivated after the 06/10/22 regulatory import   Hyperlipidemia    on meds   Hypertension    on meds   Intermittent palpitations 05/07/2018   Irregular heart beat 09/16/2022   Local reaction to COVID-19 vaccine 02/25/2020    Assessment: Patient Reported Symptoms:  Cognitive Cognitive Status: Alert and oriented to person, place, and time, Normal speech and language skills, Struggling with memory recall Cognitive/Intellectual Conditions Management [RPT]: None reported or documented in medical history or problem list   Health Maintenance Behaviors: Annual physical exam, Healthy diet, Immunizations Health Facilitated by: Healthy diet, Rest  Neurological Neurological Review of Symptoms: Numbness Neurological Management Strategies: Routine screening Neurological Self-Management Outcome: 3 (uncertain) Neurological Comment: numbness to toes, worse on right great toe  HEENT  HEENT Symptoms Reported: No symptoms reported      Cardiovascular Cardiovascular Symptoms Reported: No symptoms reported Does patient have uncontrolled Hypertension?: No    Respiratory Respiratory Symptoms Reported: Productive cough Other Respiratory Symptoms: smokers cough Respiratory  Management Strategies: Routine screening Respiratory Self-Management Outcome: 3 (uncertain)  Endocrine Endocrine Symptoms Reported: Increased urination, Headaches, Hyperglycemia Is patient diabetic?: Yes Is patient checking blood sugars at home?: Yes List most recent blood sugar readings, include date and time of day: no FBS today, ususally checking in the later afternoon Endocrine Self-Management Outcome: 3 (uncertain)  Gastrointestinal Gastrointestinal Symptoms Reported: No symptoms reported      Genitourinary Genitourinary Symptoms Reported: Itching or irritation, Pain/burning with urination Additional Genitourinary Details: s/p UTI in December; history of yeast Genitourinary Management Strategies: Medication therapy Genitourinary Self-Management Outcome: 3 (uncertain)  Integumentary  Integumentary Symptoms Reported: No symptoms reported     Musculoskeletal Musculoskelatal Symptoms Reviewed: Other Other Musculoskeletal Symptoms: bilateral leg pain after walking on them all day at work Musculoskeletal Management Strategies: Routine screening, Adequate rest Musculoskeletal Self-Management Outcome: 3 (uncertain) Falls in the past year?: No Number of falls in past year: 1 or less Was there an injury with Fall?: No Fall Risk Category Calculator: 0 Patient Fall Risk Level: Low Fall Risk Patient at Risk for Falls Due to: Other (Comment) (bilateral leg pain after walking at work) Fall risk Follow up: Falls evaluation completed  Psychosocial Psychosocial Symptoms Reported: Alteration in eating habits   Major Change/Loss/Stressor/Fears (CP): Denies Quality of Family Relationships: involved, helpful, supportive Do you feel physically threatened by others?: No    03/26/2024    PHQ2-9 Depression Screening   Jane Lopez interest or pleasure in doing things    Feeling down, depressed, or hopeless    PHQ-2 - Total Score    Trouble falling or staying asleep, or sleeping too much    Feeling tired  or having Jane Lopez energy    Poor appetite or overeating     Feeling bad about yourself -  or that you are a failure or have let yourself or your family down    Trouble concentrating on things, such as reading the newspaper or watching television    Moving or speaking so slowly that other people could have noticed.  Or the opposite - being so fidgety or restless that you have been moving around a lot more than usual    Thoughts that you would be better off dead, or hurting yourself in some way    PHQ2-9 Total Score    If you checked off any problems, how difficult have these problems made it for you to do your work, take care of things at home, or get along with other people    Depression Interventions/Treatment      There were no vitals filed for this visit. Pain Scale: Not given for pain  Medications Reviewed Today     Reviewed by Morgan Clayborne CROME, RN (Registered Nurse) on 03/26/24 at 1059  Med List Status: <None>   Medication Order Taking? Sig Documenting Provider Last Dose Status Informant  amLODipine  (NORVASC ) 10 MG tablet [id number] Yes Take 1 tablet (10 mg total) by mouth daily. Early, Sara E, NP  Active   Blood Glucose Monitoring Suppl (ONETOUCH VERIO FLEX SYSTEM) w/Device KIT [id number]  1 each by Does not apply route daily. Use to check blood sugar once daily Early, Sara E, NP  Active   Dulaglutide  (TRULICITY ) 4.5 MG/0.5ML EMMANUEL [id number] Yes Inject 4.5 mg as directed once a week. Early, Sara E, NP  Active   ferrous sulfate  325 (65 FE) MG tablet [id number] Yes TAKE ONE TABLET TWICE A WEEK FOR LOW IRON . Early, Sara E, NP  Active   glucose blood (ONETOUCH VERIO) test strip [id number]  Use one test strip daily to check blood sugar as instructed. Early, Sara E, NP  Active   Insulin  Pen Needle 32G X 4 MM MISC [id number]  Use with insulin  medication Henson, Vickie L, NP-C  Active   losartan -hydrochlorothiazide (HYZAAR) 100-25 MG tablet [id number] Yes Take 1 tablet by mouth daily. Early, Sara E, NP   Active   metFORMIN  (GLUCOPHAGE ) 1000 MG tablet [id number] Yes TAKE 1 TABLET DAILY WITH BREAKFAST. Early, Sara E, NP  Active   nitrofurantoin , macrocrystal-monohydrate, (MACROBID ) 100 MG capsule [id number]  Take 1 capsule (100 mg total) by mouth 2 (two) times daily. Randol Dawes, MD  Consider Medication Status and Discontinue (Completed Course)   OneTouch [employer] Lancets 33G MISC [id number]  100 each by Does not apply route daily. Use 1 lancet to check sugar once daily as instructed Early, Sara E, NP  Active             Recommendation:   PCP Follow-up  06/24/2024 Status: Sch   Time: 3:30 PM Length: 45  Visit Type: PHYSICAL [1005] Copay: $0.00  Provider: Oris Camie BRAVO, NP Department: [facility]   Follow Up Plan:   Telephone follow up appointment date/time   03/29/2024 Status: Sch   Time: 3:30 PM Length: 60  Visit Type: VBCI TELEPHONE CALL 60 [2503] Copay: $0.00  Provider: Maranda Lister D Department: [facility]   04/15/2024 Status: Sch   Time: 3:30 PM Length: 30  Visit Type: PATIENT OUTREACH 30 [3016] Copay: $0.00  Provider: PFM-PHARMACIST Department: [facility]   04/23/2024 Status: Sch   Time: 3:30 PM Length: 30  Visit Type: VBCI TELEPHONE CALL 30 [2502] Copay: $0.00  Provider: Morgan Clayborne CROME, RN Department: [facility]   Clayborne Morgan RN BSN CCM  [facility] Health  [facility] [address] [facility], [facility] Health Nurse Care Coordinator  Direct Dial : [phone] Website: [email or web address]

## 2024-03-26 NOTE — Patient Instructions (Signed)
 Visit Information  Thank you for taking time to visit with me today. Please don't hesitate to contact me if I can be of assistance to you before our next scheduled appointment.  Our next appointment is by telephone on Friday, February 13 at 3:30 PM Please call the care guide team at [phone] if you need to cancel or reschedule your appointment.   Following is a copy of your care plan:   Goals Addressed             This Visit's Progress    VBCI RN Care Plan related to bilateral leg pain       Problems:  Chronic Disease Management support and education needs related to bilateral leg pain   Goal: Over the next 90 days the Patient will continue to work with RN Care Manager and/or Social Worker to address care management and care coordination needs related to bilateral leg pain  as evidenced by adherence to care management team scheduled appointments      Interventions:   Pain Interventions: Pain assessment performed Medications reviewed Reviewed provider established plan for pain management Discussed importance of adherence to all scheduled medical appointments Counseled on the importance of reporting any/all new or changed pain symptoms or management strategies to pain management provider Advised patient to report to care team affect of pain on daily activities Discussed use of relaxation techniques and/or diversional activities to assist with pain reduction (distraction, imagery, relaxation, massage, acupressure, TENS, heat, and cold application Reviewed with patient prescribed pharmacological and nonpharmacological pain relief strategies Advised patient to discuss persistent bilateral leg pain  with provider Sent in basket message to PCP to advise of patient's persistent bilateral leg pain  Discussed plans with patient for ongoing nurse care management follow up and provided patient with direct contact information for nurse case management   Patient Self-Care Activities:   Attend all scheduled provider appointments Call pharmacy for medication refills 3-7 days in advance of running out of medications Call provider office for new concerns or questions  Take medications as prescribed   Work with the social worker to address care coordination needs and will continue to work with the clinical team to address health care and disease management related needs Work with the pharmacist to address medication management needs and will continue to work with the clinical team to address health care and disease management related needs  Recommendation:   PCP Follow-up  06/24/2024 Status: Sch    Time: 3:30 PM Length: 45  Visit Type: PHYSICAL [1005] Copay: $0.00  Provider: Oris Camie BRAVO, NP Department: [facility]    Follow Up Plan:   Telephone follow up appointment date/time   03/29/2024 Status: Sch    Time: 3:30 PM Length: 60  Visit Type: VBCI TELEPHONE CALL 60 [2503] Copay: $0.00  Provider: Maranda Lister D Department: [facility]    04/15/2024 Status: Sch    Time: 3:30 PM Length: 30  Visit Type: PATIENT OUTREACH 30 [3016] Copay: $0.00  Provider: PFM-PHARMACIST Department: [facility]    04/23/2024 Status: Sch    Time: 3:30 PM Length: 30  Visit Type: VBCI TELEPHONE CALL 30 [2502] Copay: $0.00  Provider: Morgan Clayborne CROME, RN Department: [facility]       VBCI RN Care Plan related to Impaired Urinary Elimination       Problems:  Chronic Disease Management support and education needs related to Impaired Urinary Elimination   Goal: Over the next 30 days the Patient will continue to work  with RN Care Manager and/or Social Worker to address care management and care coordination needs related to Impaired Urinary Elimination  as evidenced by adherence to care management team scheduled appointments      Interventions:   Evaluation of current treatment plan related to Impaired Urinary Elimination , Financial constraints related to  paying insurance premiums self-management and patient's adherence to plan as established by provider Determined patient is experiencing pain with urination, she was treated for a UTI in December, completing a full course of Macrobid  Determined patient did not provide a urine recheck Educated patient re: potential complications related to untreated UTI Sent an in basket message to PCP provider, Camie Doing NP advising of patient's reported symptoms  Discussed plans with patient for ongoing care management follow up and provided patient with direct contact information for care management team  Patient Self-Care Activities:  Attend all scheduled provider appointments Call pharmacy for medication refills 3-7 days in advance of running out of medications Call provider office for new concerns or questions  Take medications as prescribed   Work with the social worker to address care coordination needs and will continue to work with the clinical team to address health care and disease management related needs Work with the pharmacist to address medication management needs and will continue to work with the clinical team to address health care and disease management related needs  Recommendation:   PCP Follow-up  06/24/2024 Status: Sch    Time: 3:30 PM Length: 45  Visit Type: PHYSICAL [1005] Copay: $0.00  Provider: Doing Camie BRAVO, NP Department: [facility]    Follow Up Plan:   Telephone follow up appointment date/time   03/29/2024 Status: Sch    Time: 3:30 PM Length: 60  Visit Type: VBCI TELEPHONE CALL 60 [2503] Copay: $0.00  Provider: Maranda Lister D Department: [facility]    04/15/2024 Status: Sch    Time: 3:30 PM Length: 30  Visit Type: PATIENT OUTREACH 30 [3016] Copay: $0.00  Provider: PFM-PHARMACIST Department: [facility]    04/23/2024 Status: Sch    Time: 3:30 PM Length: 30  Visit Type: VBCI TELEPHONE CALL 30 [2502] Copay: $0.00  Provider: Morgan Clayborne CROME,  RN Department: [facility]             Please call 1-800-273-TALK (toll free, 24 hour hotline) if you are experiencing a Mental Health or Behavioral Health Crisis or need someone to talk to.  Patient verbalized understanding of Care plan and visit instructions communicated this visit  Clayborne Morgan RN BSN CCM [facility]  [facility], [facility] Health Nurse Care Coordinator  Direct Dial : [phone] Website: [email or web address]

## 2024-03-29 ENCOUNTER — Telehealth: Payer: Self-pay | Admitting: Licensed Clinical Social Worker

## 2024-04-05 ENCOUNTER — Other Ambulatory Visit: Payer: Self-pay | Admitting: Licensed Clinical Social Worker

## 2024-04-05 NOTE — Patient Instructions (Signed)
 Visit Information  Thank you for taking time to visit with me today. Please don't hesitate to contact me if I can be of assistance to you before our next scheduled appointment.  Our next appointment is by telephone on 04/20/2024 at 3:30 pm Please call the care guide team at [phone] if you need to cancel or reschedule your appointment.   Following is a copy of your care plan:   Goals Addressed             This Visit's Progress    BSW VBCI Social Work Care Plan       Current SDOH Barriers:  Patient currently has Management Consultant and it is set to expire and end the end of the month (January 2026) Patient was told that she should apply for Medicaid and has tried the epass and has been having  a difficult time.   Interventions: Patient interviewed and appropriate screenings performed Referred patient to community resources  Provided patient with information about The Medicaid walk in clinic and SW will send an email to the clinic and ask if they will call the patient and assist her with applying           Please call the Suicide and Crisis Lifeline: 988 go to [facility] Urgent Care [address] [place] [phone]) call 911 if you are experiencing a Mental Health or Behavioral Health Crisis or need someone to talk to.  Patient verbalized understanding of Care plan and visit instructions communicated this visit  Tobias CHARM Maranda HEDWIG, PhD [facility], [facility] Social Worker Direct Dial : [phone]  Fax: [phone]

## 2024-04-05 NOTE — Patient Outreach (Signed)
 Social Drivers of Health  Community Resource and Care Coordination Visit Note   04/05/2024  Name: Jane Lopez MRN: [id number] DOB:16-Feb-1961  Situation: Referral received for [facility] needs assessment and assistance related to Insurance to apply for Medicaid . I obtained verbal consent from Patient.  Visit completed with Patient on the phone.   Background:      Assessment:   Goals Addressed             This Visit's Progress    BSW VBCI Social Work Care Plan       Current SDOH Barriers:  Patient currently has Management Consultant and it is set to expire and end the end of the month (January 2026) Patient was told that she should apply for Medicaid and has tried the epass and has been having  a difficult time.   Interventions: Patient interviewed and appropriate screenings performed Referred patient to community resources  Provided patient with information about The Medicaid walk in clinic and SW will send an email to the clinic and ask if they will call the patient and assist her with applying           Recommendation:   attend all scheduled provider appointments ask for help if you don't understand your health insurance benefits Apply for Medicaid when the walk in clinic calls you   Follow Up Plan:   Telephone follow up appointment date/time:  04/20/2024 at 3;30 pm  Tobias CHARM Maranda HEDWIG, PhD [facility], [facility] Social Worker Direct Dial : [phone]  Fax: [phone]

## 2024-04-15 ENCOUNTER — Other Ambulatory Visit

## 2024-04-15 DIAGNOSIS — E118 Type 2 diabetes mellitus with unspecified complications: Secondary | ICD-10-CM

## 2024-04-15 DIAGNOSIS — F17200 Nicotine dependence, unspecified, uncomplicated: Secondary | ICD-10-CM

## 2024-04-15 NOTE — Progress Notes (Unsigned)
 "  04/15/2024 Name: Jane Lopez MRN: [id number] DOB: Jan 10, 1961  Chief Complaint  Patient presents with   Medication Management   Diabetes    Jane Lopez is a 64 y.o. year old female who presented for a telephone visit.   They were referred to the pharmacist for assistance in managing diabetes (TNM)   Subjective:  Care Team: Primary Care Provider: Oris Camie FORBES, NP ;  Medication Access/Adherence  Current Pharmacy:  [id number] - [place],  - [address] AT [facility] [place] [facility] [address] [place] [address] Phone: [phone] Fax: [phone]   Patient reports affordability concerns with their medications: No Patient reports access/transportation concerns to their pharmacy: No  Patient reports adherence concerns with their medications:  No  - fill dates suggest poor adherence but patient denies missed doses   Diabetes:  Current medications: Trulicity  4.5mg , Metformin  1000mg  Medications tried in the past: Toujeo , Ozempic , Victoza   Current glucose readings: Has been checking once daily and denies any readings outside of target rnage  Patient denies hypoglycemic s/sx including dizziness, shakiness, sweating. Patient denies hyperglycemic symptoms including polyuria, polydipsia, polyphagia, nocturia, neuropathy, blurred vision.   Current medication access support: None  Has new insurance, denies any issues obtaining her medications  Tobacco Abuse:  Tobacco Use History: Age when started using tobacco on a daily basis: at least 10 years ago Number of cigarettes per day: at least 10 per day Smokes first cigarette within 30 minutes after waking Does not wake at night to smoke Triggers include: habit  Quit Attempt History: Longest time she has ever been smoke free was with each pregnancy but picked habit back up each time Reports she tried nicotine patch in the past but this did not work   Objective:  Lab Results   Component Value Date   HGBA1C 6.5 (H) 01/08/2024    Lab Results  Component Value Date   CREATININE 1.02 (H) 04/28/2023   BUN 13 04/28/2023   NA 141 04/28/2023   K 4.5 04/28/2023   CL 105 04/28/2023   CO2 25 04/28/2023    Lab Results  Component Value Date   CHOL 156 04/28/2023   HDL 40 04/28/2023   LDLCALC 101 (H) 04/28/2023   LDLDIRECT 107 (H) 04/28/2023   TRIG 80 04/28/2023   CHOLHDL 3.8 09/16/2022    Medications Reviewed Today     Reviewed by Lionell Jon DEL, RPH (Pharmacist) on 04/15/24 at 1541  Med List Status: <None>   Medication Order Taking? Sig Documenting Provider Last Dose Status Informant  amLODipine  (NORVASC ) 10 MG tablet [id number] Yes Take 1 tablet (10 mg total) by mouth daily. Early, Sara E, NP  Active   Blood Glucose Monitoring Suppl (ONETOUCH VERIO FLEX SYSTEM) w/Device KIT [id number]  1 each by Does not apply route daily. Use to check blood sugar once daily Early, Sara E, NP  Active   Dulaglutide  (TRULICITY ) 4.5 MG/0.5ML EMMANUEL [id number] Yes Inject 4.5 mg as directed once a week. Early, Sara E, NP  Active   ferrous sulfate  325 (65 FE) MG tablet [id number] Yes TAKE ONE TABLET TWICE A WEEK FOR LOW IRON . Early, Camie FORBES, NP  Active   glucose blood (ONETOUCH VERIO) test strip [id number]  Use one test strip daily to check blood sugar as instructed. Early, Sara E, NP  Active   Insulin  Pen Needle 32G X 4 MM MISC [id number]  Use with insulin  medication Henson, Vickie L, NP-C  Active   losartan -hydrochlorothiazide (HYZAAR) 100-25  MG tablet [id number] Yes Take 1 tablet by mouth daily. Early, Sara E, NP  Active   metFORMIN  (GLUCOPHAGE ) 1000 MG tablet [id number] Yes TAKE 1 TABLET DAILY WITH BREAKFAST. Early, Sara E, NP  Active     Discontinued 04/15/24 1541 (Completed Course)   OneTouch Delica Lancets 33G MISC [id number]  100 each by Does not apply route daily. Use 1 lancet to check sugar once daily as instructed Early, Camie BRAVO, NP  Active               Assessment/Plan:    Diabetes: - Currently controlled - Reviewed long term cardiovascular and renal outcomes of uncontrolled blood sugar - Reviewed goal A1c, goal fasting, and goal 2 hour post prandial glucose - Reviewed dietary modifications including low carb diet - Patient denies personal or family history of multiple endocrine neoplasia type 2, medullary thyroid cancer; personal history of pancreatitis or gallbladder disease. - Recommend to check glucose daily -Recommend to continue current med therapy  Tobacco Abuse - Currently uncontrolled - Provided motivational interviewing to assess tobacco use and strategies for reduction - Provided information on 1 800 QUIT NOW support program - - start varenicline  0.5 mg daily for 3 days, then 0.5 mg twice daily for 4 days, then 1 mg twice daily thereafter. Counseled to take with food to reduce risk of GI adverse effects. Also counseled to stop therapy and contact provider if change in mood. Pending approval by PCP - Declines any NRT. Will discuss quit date at next appt once chantix  picked up  Msc: Complains of continued urinary burning, ongoing since December. Would like another office visit. Patient wishes to only see a female provider in our office, scheduled for next thurs at 3:30pm      Follow Up Plan: 2 weeks  Jon VEAR [place], PharmD Clinical Pharmacist [phone]   "

## 2024-04-16 MED ORDER — VARENICLINE TARTRATE (STARTER) 0.5 MG X 11 & 1 MG X 42 PO TBPK: ORAL_TABLET | ORAL | 0 refills | Status: AC

## 2024-04-20 ENCOUNTER — Other Ambulatory Visit: Admitting: Licensed Clinical Social Worker

## 2024-04-22 ENCOUNTER — Ambulatory Visit: Payer: Self-pay | Admitting: Family Medicine

## 2024-04-23 ENCOUNTER — Telehealth

## 2024-04-29 ENCOUNTER — Other Ambulatory Visit: Payer: Self-pay

## 2024-06-24 ENCOUNTER — Encounter: Payer: Self-pay | Admitting: Nurse Practitioner
